# Patient Record
Sex: Male | Born: 1960 | Race: White | Hispanic: No | Marital: Married | Smoking: Never smoker
Health system: Southern US, Community
[De-identification: ages and names within clinical notes are randomized; demographics above are authoritative.]

## PROBLEM LIST (undated history)

## (undated) DIAGNOSIS — E7849 Other hyperlipidemia: Secondary | ICD-10-CM

## (undated) HISTORY — DX: Other hyperlipidemia: E78.49

---

## 1997-09-18 ENCOUNTER — Encounter: Admission: RE | Admit: 1997-09-18 | Discharge: 1997-12-17 | Payer: Self-pay | Admitting: Family Medicine

## 2007-07-13 ENCOUNTER — Encounter: Admission: RE | Admit: 2007-07-13 | Discharge: 2007-07-13 | Payer: Self-pay | Admitting: Cardiology

## 2008-05-12 ENCOUNTER — Ambulatory Visit: Payer: Self-pay | Admitting: Internal Medicine

## 2008-05-15 ENCOUNTER — Ambulatory Visit: Payer: Self-pay | Admitting: Internal Medicine

## 2008-05-15 LAB — CONVERTED CEMR LAB
BUN: 21 mg/dL (ref 6–23)
Basophils Relative: 0.9 % (ref 0.0–3.0)
CO2: 30 meq/L (ref 19–32)
Calcium: 9 mg/dL (ref 8.4–10.5)
Chloride: 108 meq/L (ref 96–112)
Creatinine, Ser: 1.1 mg/dL (ref 0.4–1.5)
Eosinophils Absolute: 0.2 10*3/uL (ref 0.0–0.7)
Eosinophils Relative: 2.8 % (ref 0.0–5.0)
Hemoglobin: 15.1 g/dL (ref 13.0–17.0)
Lymphocytes Relative: 32.8 % (ref 12.0–46.0)
MCHC: 34.3 g/dL (ref 30.0–36.0)
MCV: 88.5 fL (ref 78.0–100.0)
Neutro Abs: 2.9 10*3/uL (ref 1.4–7.7)
Neutrophils Relative %: 51.4 % (ref 43.0–77.0)
RBC: 4.98 M/uL (ref 4.22–5.81)
WBC: 5.8 10*3/uL (ref 4.5–10.5)

## 2008-05-21 ENCOUNTER — Ambulatory Visit (HOSPITAL_COMMUNITY): Admission: RE | Admit: 2008-05-21 | Discharge: 2008-05-22 | Payer: Self-pay | Admitting: Internal Medicine

## 2008-05-21 ENCOUNTER — Ambulatory Visit: Payer: Self-pay | Admitting: Internal Medicine

## 2008-06-21 DIAGNOSIS — E669 Obesity, unspecified: Secondary | ICD-10-CM

## 2008-06-21 DIAGNOSIS — I491 Atrial premature depolarization: Secondary | ICD-10-CM | POA: Insufficient documentation

## 2008-06-21 DIAGNOSIS — E785 Hyperlipidemia, unspecified: Secondary | ICD-10-CM

## 2008-06-21 DIAGNOSIS — I498 Other specified cardiac arrhythmias: Secondary | ICD-10-CM | POA: Insufficient documentation

## 2008-07-18 ENCOUNTER — Encounter (INDEPENDENT_AMBULATORY_CARE_PROVIDER_SITE_OTHER): Payer: Self-pay | Admitting: *Deleted

## 2010-07-20 NOTE — Op Note (Signed)
[name], [name]            ACCOUNT NO.:  [id number]   MEDICAL RECORD NO.:  [id number]          PATIENT TYPE:  OIB   LOCATION:  3731                         FACILITY:  [facility]   PHYSICIAN:  Doylene Canning. Ladona Ridgel, MD    DATE OF BIRTH:  1960/09/20   DATE OF PROCEDURE:  DATE OF DISCHARGE:                               OPERATIVE REPORT   PROCEDURE PERFORMED:  Left physiologic study and RF catheter ablation of  a concealed left lateral accessory pathway which had been causing  supraventricular tachycardia.   INTRODUCTION:  The patient is a 50 year old male with a long-standing  history of tachy palpitations which have dramatically increased in  frequency and severity in the last year.  In the last several weeks, he  has had episodes almost daily.  These start and stop suddenly.  He has  been intolerant to medical therapy, and he is now referred for catheter  ablation.  The patient has had documented SVT at rates of 140-160 beats  per minute.  His baseline ECG demonstrates normal sinus rhythm with no  pre-excitation.   PROCEDURE:  After informed consent was obtained, the patient was taken  to the diagnostic EP lab in a fasting state.  Usual preparation and  draping, intravenous fentanyl and midazolam was given for sedation.  A 6-  [place] hexapolar catheter was inserted percutaneously into the right  jugular vein and advanced to the coronary sinus.  This was under x-ray  guidance.  A 5-French quadripolar catheter was inserted percutaneously  in the right femoral vein and advanced under fluoroscopic guidance to  the right ventricle.  A 5-French quadripolar catheter was inserted  percutaneously in the right femoral vein and advanced to the His bundle  region.  After measurement of the basic intervals, rapid ventricular  pacing was carried out at 600 msec demonstrating eccentric atrial  activation.  Rapid ventricular pacing was carried out down to 350 msec  except where the VA Wenckebach  cycle length was demonstrated in the  accessory pathway.  Programmed ventricular stimulation was then carried  out at base drive cycle length of 161 msec from the right ventricle.  The S1-S2 interval was stepwise decreased to 360 msec where the  retrograde pathway ERP was observed.  During programmed ventricular  stimulation, the atrial activation was eccentric and nondecremental.  There were brief prepares of nonsustained SVT during programmed  ventricular stimulation.  Next, rapid atrial pacing was carried out from  the coronary sinus in the high right atrium at base drive cycle length  of 096 msec and stepwise decreased down to 430 msec where AV Wenckebach  was observed.  It should be noted that there was no evidence of any  antegrade accessory pathway conduction during rapid atrial pacing both  in the right atrium as well as in the coronary sinus.  Next, programmed  atrial stimulation was carried out from the coronary sinus in the right  atrium with base drive cycle length of 045 msec and the S1-S2 interval  was stepwise decreased to 420 msec.  There were multiple AH jumps and  echo beats during programmed atrial  stimulation, but there was no  inducible SVT.  Again, there was no evidence of any pre-excitation with  atrial pacing.  At this point, with nonsustained SVT and clear  nondecremental eccentric atrial activation demonstrating a concealed  left lateral accessory pathway, the 7-French quadripolar ablation  catheter was inserted percutaneously in the right femoral artery and  advanced retrograde across the aortic valve into the left ventricle.  Heparin 5000 units was then given.  Mapping was then carried out with  ventricular pacing.  The earliest atrial activation was demonstrated at  a position approximately 3 o'clock on the mitral valve annulus.  RF  energy application was initially applied to the atrial insertion of the  accessory pathway but contact was difficult.   Eventually, the ablation  catheter was withdrawn just slightly into the ventricular insertion, and  with 2 additional RF energy applications for a total of 6 RF energy  applications accessory pathway conduction was abolished and ventricular  pacing demonstrated VA dissociation.  At this point, the patient was  observed for 45 minutes.  During this time, because of the patient's AH  jumps and echo beats, isoproterenol was infused and rapid atrial pacing  was subsequently carried out.  This demonstrated no evidence of any  additional inducible SVT and no additional evidence of any accessory  pathway conduction either with atrial pacing or with ventricular pacing.  The catheter was then removed.  Hemostasis was assured, and the patient  was returned to his room in satisfactory condition.   COMPLICATIONS:  There were no immediate procedure complications.   FULL RESULTS:  1. Baseline ECG.  Baseline ECG demonstrates sinus rhythm with frequent      PVCs.  2. Baseline intervals.  Sinus node cycle length was 900 msec.  The HV      interval was 45 msec.  QRS duration was 70 msec.  The AH interval      was approximately 100 msec.  3. Rapid ventricular pacing.  Rapid ventricular pacing was carried out      from the RV apex demonstrating VA Wenckebach cycle length of 350      msec with nondecremental eccentric atrial activation.  Following      successful catheter ablation, VA dissociation was demonstrated at      600 msec.  4. Programmed ventricular stimulation.  Programmed ventricular      stimulation was carried out from the RV apex at a base drive cycle      length of 600 msec in the S1-S2 interval stepwise decreased down to      360 msec where retrograde block in the accessory pathway was      demonstrated.  Following successful ablation, VA dissociation was      present with no evidence of any accessory pathway conduction.  With      isoproterenol infused, VA Wenckebach was demonstrated  through the      AV node at approximately 480 msec.  5. Rapid atrial pacing.  Rapid atrial pacing was carried out from the      coronary sinus and the high right atrium with baseline cycle length      of 600 msec and stepwise decreased down to 430 msec where AV      Wenckebach was observed.  During rapid atrial pacing, the PR      interval was less than the RR interval, and there was no sustained      SVT.  Following catheter ablation and on isoproterenol, rapid  atrial pacing was again carried out again demonstrating that the PR      interval was less than the RR interval, and there was no inducible      SVT.  6. Programmed atrial stimulation.  Programmed atrial stimulation was      carried out from the coronary sinus and high right atrium with base      drive cycle length of 409 msec with and without isoproterenol.      During programmed atrial stimulation, there was no evidence of any      antegrade accessory pathway conduction, and the AV node ERP was      600/420.  There were AH jumps initially and echo beats, but no      inducible SVT.  Following ablation, AH jumps persisted, but there      were no echo beats noted.  7. Arrhythmias observed.      a.     AV reentrant tachycardia.  Initiation was spontaneous.       Duration was nonsustained.  Termination was spontaneous.  Cycle       length was approximately 400 msec  8. Mapping.  Mapping of the patient's accessory pathway demonstrated      accessory pathway conduction to be present at approximately 3      o'clock on the mitral valve annulus.  9. RF energy application total of 6 RF energy applications were      delivered.  Initial to the atrial and then on the ventricular      insertion of the accessory pathway along the mitral valve annulus      at about 3 o'clock in the LAO projection.  During the fifth RF      energy application, VA dissociation was produced, and accessory      pathway conduction was abolished.  A bonus RF  energy application      was delivered then, and the patient was observed for 45 minutes      with no SVT and no accessory pathway conduction.  It should be      noted that following the conclusion of the procedure, the patient      had frequent PACs and PJCs.   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  A total of 6 RF energy  applications were delivered, and following ablation there was no  inducible SVT and no evidence of any residual accessory pathway  conduction.      Doylene Canning. Ladona Ridgel, MD  Electronically Signed     GWT/MEDQ  D:  05/21/2008  T:  05/21/2008  Job:  [id number]   cc:   Georga Hacking, M.D.  Holley Bouche, M.D.

## 2010-07-20 NOTE — Assessment & Plan Note (Signed)
[facility]                         ELECTROPHYSIOLOGY OFFICE NOTE   NAME:[name], Pellow                   MRN:          [id number]  DATE:05/12/2008                            DOB:          [birth year]    Mr. Tozzi is referred today by Dr. Viann Fish for evaluation of  SVT.  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  He has had a long history of  tachy palpitations which initially developed back in his teens.  They  were quiet for many years and asymptomatic but over the last several  years they have increased in frequency and severity.  He notes that in  the last year, he has had episodes over once a week, typically lasting  for up to an hour duration.  His episodes occur suddenly and stop  suddenly and leave him tired for the next day.  He has taken verapamil  p.r.n., though, he has not had much success with this.  He notes his  episodes of tachy palpitations have been as fast as to 200 beats per  minute.  He is referred now for additional evaluation.  In addition, he  has very mild chest pressure with his episodes of SVT.  He also gets  lightheaded but has never had frank syncope.   His past medical history is notable for dyslipidemia.  He has very mild  obesity.   His past surgical history is notable for knee surgery on the left leg.  He has a history of dyslipidemia.   Family history is negative for premature coronary artery disease.   SOCIAL HISTORY:  The patient rarely uses alcohol.  He does not smoke  cigarettes.  He is married.   He gives no known drug allergies.   His review of systems is really negative except as noted in the HPI,  otherwise, all systems reviewed and negative.   PHYSICAL EXAMINATION:  GENERAL:  He is a pleasant well-appearing middle-  aged man in no distress.  VITAL SIGNS:  Blood pressure was 120/70, the pulse was 62 and regular,  respirations were 18, the weight was 228 pounds.  HEENT:  Normocephalic and atraumatic.  Pupils equal and round.  Oropharynx is moist.  Sclerae anicteric.  NECK:  No jugular distention.  There are no thyromegaly.  Trachea is  midline.  The carotids are 2+ and symmetric.  LUNGS:  Clear bilaterally on auscultation.  No wheezes, rales, or  rhonchi are present.  No increased work of breathing.  CARDIOVASCULAR:  Regular rate and rhythm.  Normal S1 and S2.  The PMI  was not enlarged, nor was it laterally displaced.  ABDOMEN:  Soft, nontender.  There is no organomegaly.  EXTREMITIES:  No cyanosis, clubbing, or edema.  Pulses were 2+ and  symmetric.  NEUROLOGIC:  He was alert and oriented x3.  His cranial nerves intact.  Strength is 5/5 and symmetric.  SKIN:  Normal.  Joint exam was normal except for a scar in his left leg.   IMPRESSION:  The EKG demonstrates sinus rhythm with no  obvious pre-  excitation.   IMPRESSION:  1. Recurrent supraventricular tachycardia.  2. Dyslipidemia.   DISCUSSION:  I recommended proceeding with electrophysiologic study and  catheter ablation.  The patient had been unable to be benefited by  verapamil and his episodes are increasingly frequent and severe.  The  risks, benefits, goals, expectations, of the procedure had been  discussed with the patient, and he will call us when he would like to  proceed with this.     Doylene Canning. Ladona Ridgel, MD  Electronically Signed    GWT/MedQ  DD: 05/12/2008  DT: 05/13/2008  Job #: [id number]

## 2010-07-20 NOTE — Discharge Summary (Signed)
NAMEBREEZE, [name]            ACCOUNT NO.:  [id number]   MEDICAL RECORD NO.:  [id number]          PATIENT TYPE:  OIB   LOCATION:  3731                         FACILITY:  [facility]   PHYSICIAN:  Doylene Canning. Ladona Ridgel, MD    DATE OF BIRTH:  November 12, 1960   DATE OF ADMISSION:  05/21/2008  DATE OF DISCHARGE:  05/22/2008                               DISCHARGE SUMMARY   This patient has no known drug allergies.  Time for the dictation and  examination for the patient greater than 35 minutes.   FINAL DIAGNOSES:  1. 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  2. Long-term history of tachypalpitations since teenage years.      a.     Palpitation occurs with breakthrough on verapamil.      b.     The patient experiences fatigue after these episodes, which       have increased in frequency and duration.   SECONDARY DIAGNOSES:  1. Dyslipidemia.  2. Evidence of supraventricular tachycardia when the patient wore a      heart monitor.  3. Echocardiogram Jul 26, 2004, ejection fraction of 60%, mitral valve      and tricuspid valve normal.   PROCEDURE:  May 21, 2008 electrophysiology study, radiofrequency  catheter ablation of atrioventricular reentry tachycardia, which  activities through a concealed left lateral accessory pathway, Dr. Lewayne Bunting.  The patient had no recurrence of his dysrhythmia.  He had mild  nausea after the procedure.  He was given Zofran and mild fluid bolus  and has done well.   BRIEF HISTORY:  Mr. [name] is a 50 year old male.  He has a long  history of tachypalpitations.  They developed initially back when he was  a teenager.  They have been quiescent for many years, but over the last  several years, they have increased in frequency and severity.  He notes  that in the last year, he has an episode about once a week.  Sometimes  they last up to an hour.  They start abruptly, they stop suddenly, they  leave him tired the whole next day.  He has verapamil to take as needed,  but he has not had success with this medication.   He notes that the tachypalpitations have been as fast as 200 beats per  minute.  He has had an event monitor, which shows evidence of narrow  complex supraventricular tachycardia.  He was referred by Dr. Viann Fish to Dr. Ladona Ridgel.  The patient says that he has lightheadedness with  these events, but never any frank syncope.  The risks and benefits of  catheter ablation study and radiofrequency catheter ablation have been  discussed with the patient and he wishes to proceed.   HOSPITAL COURSE:  The patient presents electively on May 21, 2008.  He  underwent electrophysiology study with application of radiofrequency  energy.  At about 3 o'clock on the mitral valve annulus, 6 burst were  delivered to a left lateral accessory pathway, which stopped his SVT and  he had no A-V conduction noted during these dysrhythmias.  There was no  recurrence of  dysrhythmia postprocedure.  The patient is discharging in  postprocedure day #1.  He goes home on the following medications;  1. Crestor 20 mg daily.  2. Enteric-coated aspirin 81 mg daily.  He has been given amoxicillin      as prophylaxis against endocarditis should he have dental work in      the next 6 months.   He has a followup with Dr. Lewayne Bunting at [facility], [address], Wednesday, June 25, 2008 at 8:30.  Laboratory  studies pertinent to this admission were drawn on May 15, 2008; white  cells 5.8, hemoglobin 15.1, hematocrit 44.1, and platelets are 151.  Protime 10.8, INR 1.0.  Sodium 141, potassium 4.4, chloride 108,  carbonate 30, glucose of 78, BUN is 21, creatinine 1.1.      Maple Mirza, PA      Doylene Canning. Ladona Ridgel, MD   Electronically Signed    GM/MEDQ  D:  05/22/2008  T:  05/22/2008  Job:  [id number]   cc:   Georga Hacking, M.D.

## 2016-04-15 ENCOUNTER — Other Ambulatory Visit: Payer: Self-pay | Admitting: Orthopedic Surgery

## 2016-04-15 ENCOUNTER — Ambulatory Visit
Admission: RE | Admit: 2016-04-15 | Discharge: 2016-04-15 | Disposition: A | Discharge status: Home / Self Care | Payer: BC Managed Care – PPO | Source: Ambulatory Visit | Attending: Orthopedic Surgery | Admitting: Orthopedic Surgery

## 2016-04-15 DIAGNOSIS — M1611 Unilateral primary osteoarthritis, right hip: Secondary | ICD-10-CM

## 2016-07-13 ENCOUNTER — Ambulatory Visit
Admission: RE | Admit: 2016-07-13 | Discharge: 2016-07-13 | Disposition: A | Discharge status: Home / Self Care | Payer: BC Managed Care – PPO | Source: Ambulatory Visit | Attending: Family Medicine | Admitting: Family Medicine

## 2016-07-13 ENCOUNTER — Other Ambulatory Visit: Payer: Self-pay | Admitting: Family Medicine

## 2016-07-13 DIAGNOSIS — Z01818 Encounter for other preprocedural examination: Secondary | ICD-10-CM

## 2018-04-29 IMAGING — CR DG CHEST 2V
2 series · 2 of 2 positions shown · non-contrast
Comparison: July 13, 2007

CLINICAL DATA: Preoperative evaluation for hip surgery

EXAM:
CHEST  2 VIEW

[w chest pa]
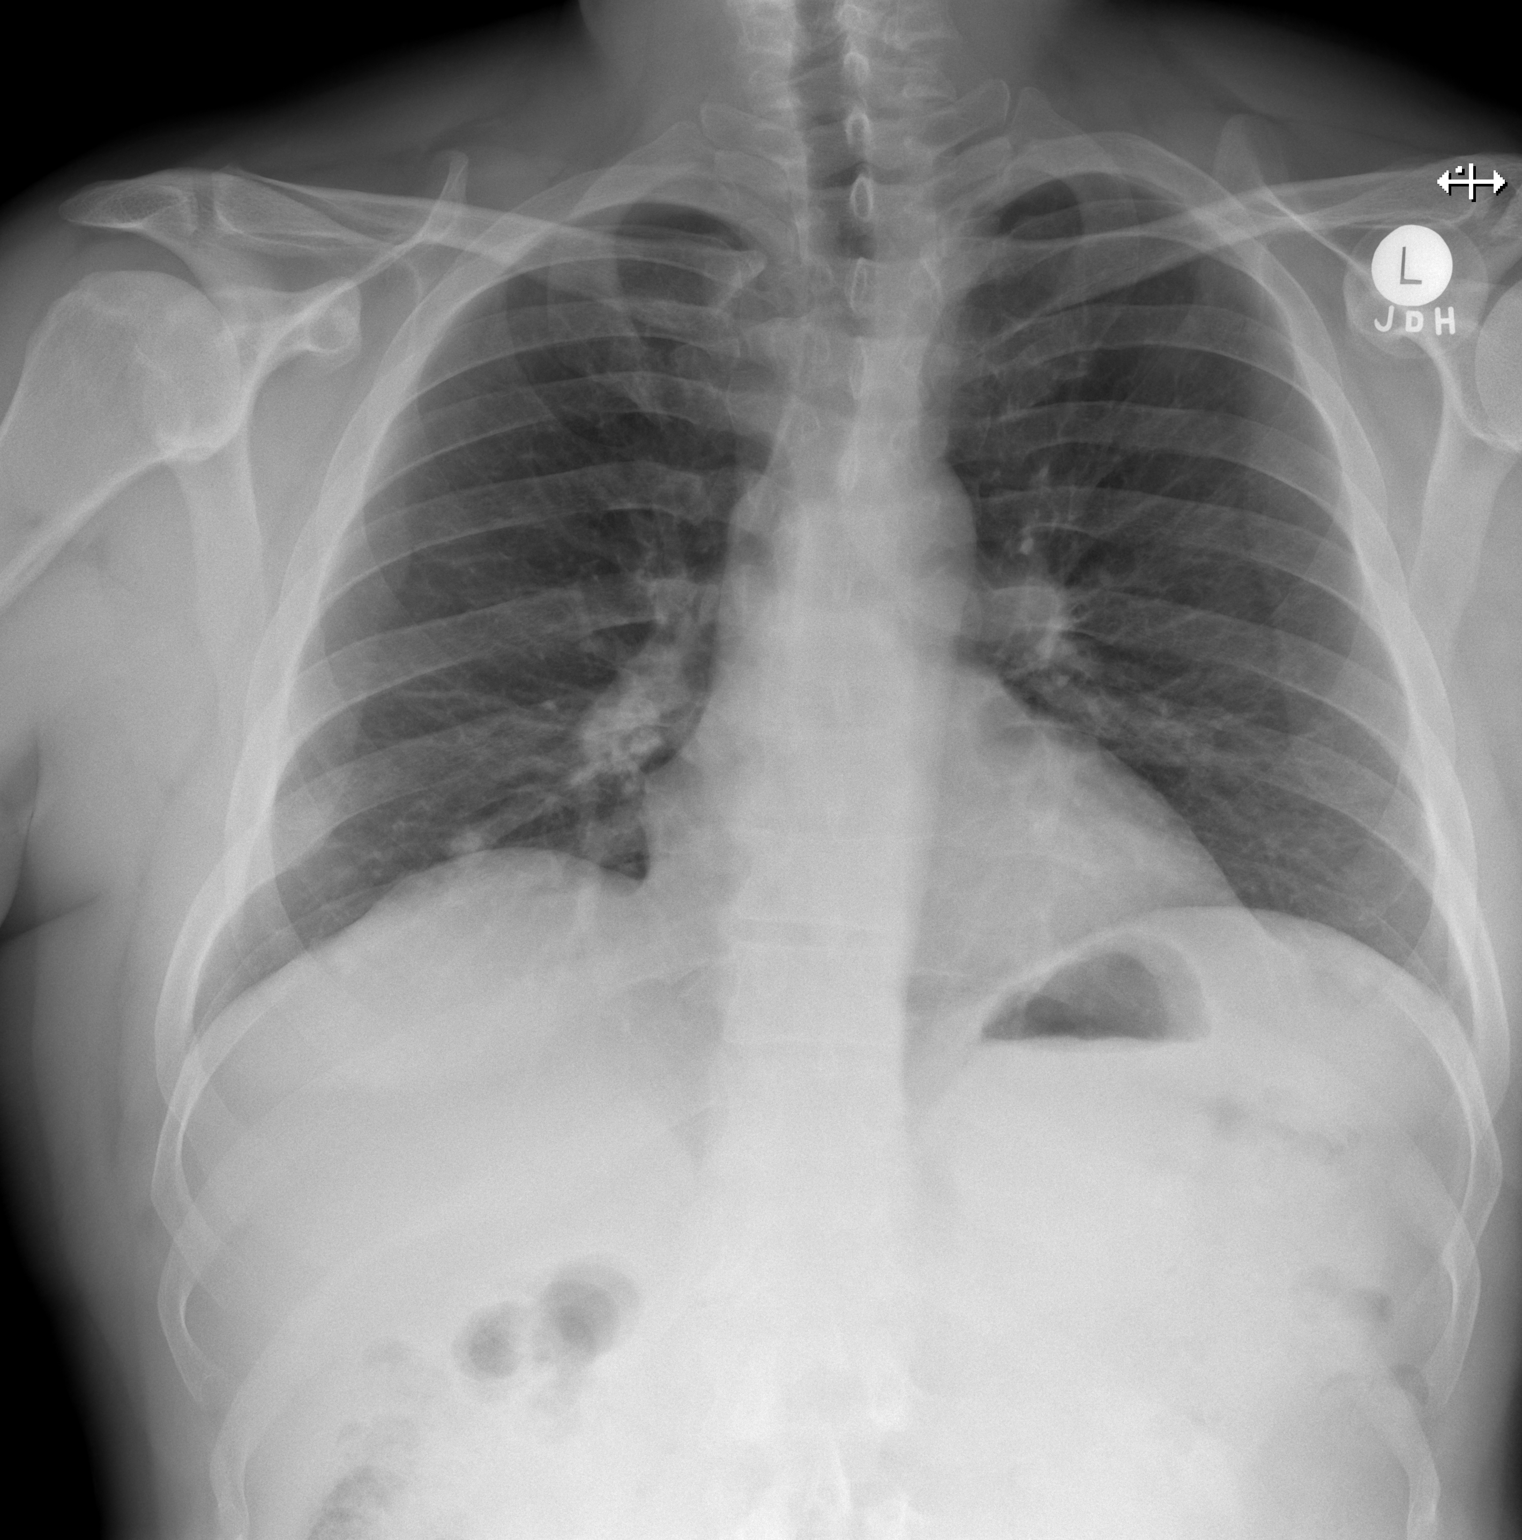

[w chest lat]
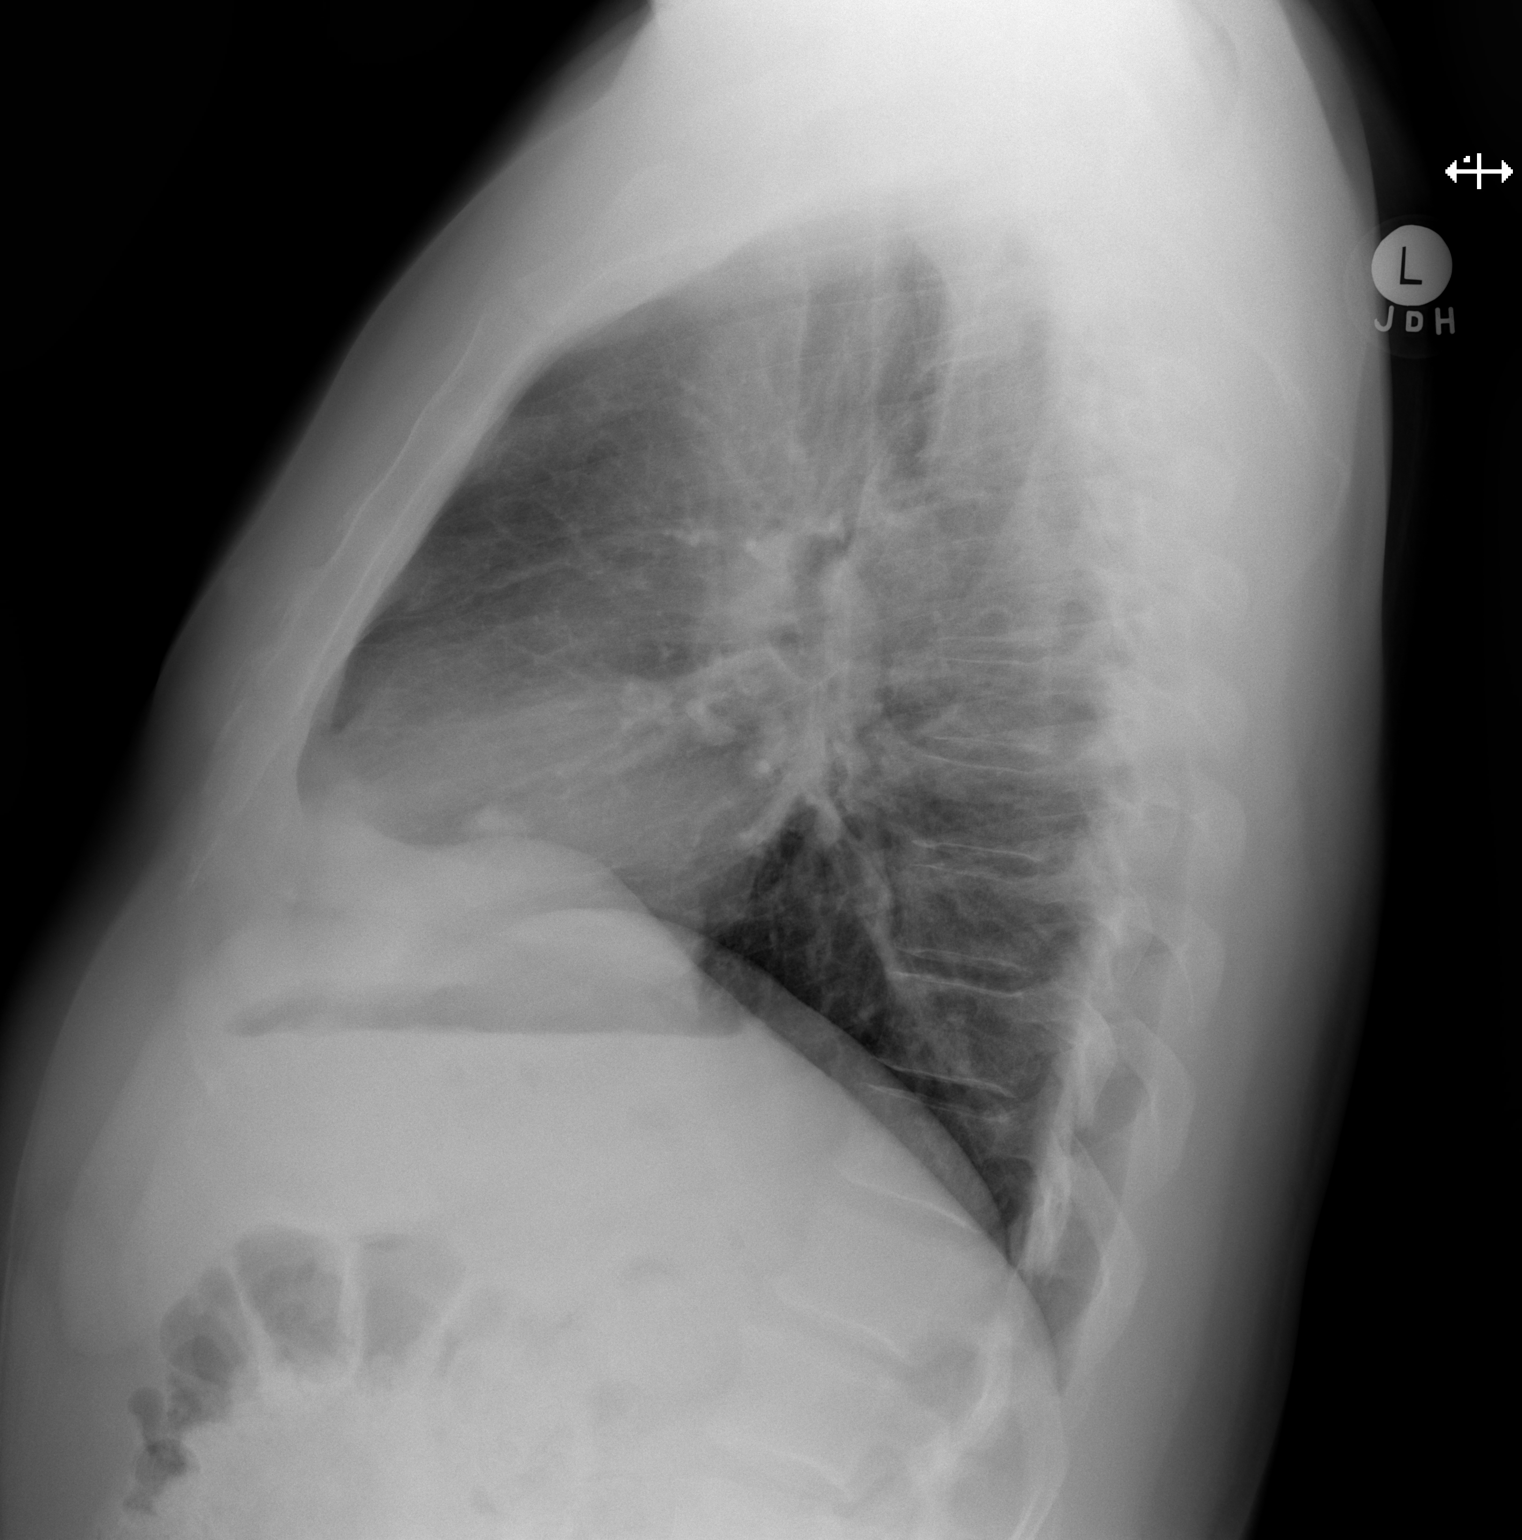

[2 of 2 positions shown; findings below may reference images not displayed]

FINDINGS: There is an apparent granuloma in the right base, stable from prior
study. Lungs elsewhere clear. Heart size and pulmonary vascularity
are normal. No adenopathy. No evident bone lesions.
IMPRESSION: Stable granuloma right base.  No edema or consolidation.

## 2021-07-26 ENCOUNTER — Ambulatory Visit (INDEPENDENT_AMBULATORY_CARE_PROVIDER_SITE_OTHER): Payer: BC Managed Care – PPO | Admitting: Internal Medicine

## 2021-07-26 ENCOUNTER — Telehealth: Payer: Self-pay | Admitting: Internal Medicine

## 2021-07-26 ENCOUNTER — Encounter: Payer: Self-pay | Admitting: Internal Medicine

## 2021-07-26 VITALS — BP 149/93 | HR 69 | Ht 71.0 in | Wt 244.4 lb

## 2021-07-26 DIAGNOSIS — T466X5A Adverse effect of antihyperlipidemic and antiarteriosclerotic drugs, initial encounter: Secondary | ICD-10-CM

## 2021-07-26 DIAGNOSIS — E7801 Familial hypercholesterolemia: Secondary | ICD-10-CM

## 2021-07-26 DIAGNOSIS — M791 Myalgia, unspecified site: Secondary | ICD-10-CM | POA: Diagnosis not present

## 2021-07-26 DIAGNOSIS — Z8249 Family history of ischemic heart disease and other diseases of the circulatory system: Secondary | ICD-10-CM

## 2021-07-26 MED ORDER — REPATHA SURECLICK 140 MG/ML ~~LOC~~ SOAJ: 1.0000 | SUBCUTANEOUS | 3 refills | Status: DC

## 2021-07-26 NOTE — Progress Notes (Signed)
LIPID CLINIC CONSULT NOTE  Chief Complaint:  Familial hyperlipidemia  Primary Care Physician: Antony Contras, MD  Primary Cardiologist:  None  HPI:  [name] is a 61 y.o. male who is being seen today for the evaluation of familial hyperlipidemia at the request of Antony Contras, MD. 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  He reports a longstanding history of high cholesterol as well as high cholesterol in multiple family members.  Particularly his mother had high cholesterol and had her first MI in her 11s.  He also has a sister with high cholesterol.  In the past he has been on both Lipitor and Crestor but had side effects with those including myalgias.  He had recent labs which indicated untreated lipids.  His total cholesterol was 318, HDL 49, triglycerides 351 and LDL 198.  PMHx:  Past Medical History:  Diagnosis Date   Familial hyperlipidemia     FAMHx:  Family History  Problem Relation Age of Onset   Hyperlipidemia Mother    Heart attack Mother     SOCHx:   has no history on file for tobacco use, alcohol use, and drug use.  ALLERGIES:  Not on File  ROS: Pertinent items noted in HPI and remainder of comprehensive ROS otherwise negative.  HOME MEDS: Current Outpatient Medications on File Prior to Visit  Medication Sig Dispense Refill   fluticasone (FLONASE) 50 MCG/ACT nasal spray Place into the nose.     levocetirizine (XYZAL) 5 MG tablet SMARTSIG:1 Tablet(s) By Mouth Every Evening     EPINEPHrine 0.3 mg/0.3 mL IJ SOAJ injection See admin instructions. (Patient not taking: Reported on 07/26/2021)     No current facility-administered medications on file prior to visit.    LABS/IMAGING: No results found for this or any previous visit (from the past 48 hour(s)). No results found.  LIPID PANEL: No results found for:  CHOL, TRIG, HDL, CHOLHDL, VLDL, LDLCALC, LDLDIRECT  WEIGHTS: Wt Readings from Last 3 Encounters:  07/26/21 244 lb 6.4 oz (110.9 kg)    VITALS: BP (!) 149/93   Pulse 69   Ht 5\' 11"  (1.803 m)   Wt 244 lb 6.4 oz (110.9 kg)   SpO2 96%   BMI 34.09 kg/m   EXAM: General appearance: alert and no distress Neck: no carotid bruit, no JVD, and thyroid not enlarged, symmetric, no tenderness/mass/nodules Lungs: clear to auscultation bilaterally Heart: regular rate and rhythm, S1, S2 normal, no murmur, click, rub or gallop Abdomen: soft, non-tender; bowel sounds normal; no masses,  no organomegaly Extremities: extremities normal, atraumatic, no cyanosis or edema Pulses: 2+ and symmetric Skin: Skin color, texture, turgor normal. 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   Mr. Cypret has probable familial hyperlipidemia with very high total cholesterol, LDL and triglycerides.  His mother also had high cholesterol and early onset heart disease.  He is asymptomatic at this time but is concerned about whether he may have developed some coronary artery disease.  I advised a calcium score.  In addition will recommend PCSK9 inhibitor for primary lipid-lowering given his LDL much greater than 190 and presumptive diagnosis of familial hyperlipidemia.  Unfortunately, he could not tolerate statins.  He may  need additional therapy going forward.  We will also go ahead and perform genetic testing today as he has children who should likely be screened.  Plan follow-up with me including a lipid NMR and LP(a) in about 3 to 4 months.  Thanks again for the kind referral  Pixie Casino, MD, FACC, [place] Director of the [facility] Diplomate of the American Board of Clinical Lipidology Attending Cardiologist  Direct Dial: [phone]  Fax: [phone]  Website:  [email or web address] Vales 07/26/2021, 5:04 PM

## 2021-07-26 NOTE — Telephone Encounter (Signed)
Genetic test for dyslipidemia/ASCVD ordered ([facility]) Cheek swab completed in office Specimen and necessary paperwork mailed. ID: [id number]

## 2021-07-26 NOTE — Patient Instructions (Signed)
Medication Instructions:  Dr. Debara Pickett recommends Repatha Sureclick or Praluent  (PCSK9). This is an injectable cholesterol medication self-administered once every 14 days. This medication will likely need prior approval with your insurance company, which we will work on. If the medication is not approved initially, we may need to do an appeal with your insurance.   Administer medication in area of fatty tissue such as abdomen, outer thigh, back of upper arm - and rotate site with each injection Store medication in refrigerator until ready to administer - allow to sit at room temp for 30 mins - 1 hour prior to injection Dispose of medication in a SHARPS container - your pharmacy should be able to direct you on this and proper disposal   If you need a co-pay card for Repatha: [email or web address] >> paying for Repatha or red box that says "Repatha Copay Card" in top right If you need a co-pay card for Praluent: [email or web address]://[email or web address].[email or web address].uy  Patient Assistance:    These foundations have funds at various times.   The PAN Foundation: https://www.panfoundation.org/disease-funds/hypercholesterolemia/ -- can sign up for wait list  The [facility] offers assistance to help pay for medication copays.  They will cover copays for all cholesterol lowering meds, including statins, fibrates, omega-3 fish oils like Vascepa, ezetimibe, Repatha, Praluent, Nexletol, Nexlizet.  The cards are usually good for $2,500 or 12 months, whichever comes first. Go to healthwellfoundation.org Click on "Apply Now" Answer questions as to whom is applying (patient or representative) Your disease fund will be "hypercholesterolemia - Medicare access" They will ask questions about finances and which medications you are taking for cholesterol When you submit, the approval is usually within minutes.  You will need to print the card information from the site You will need to show this information to your pharmacy,  they will bill your Medicare Part D plan first -then bill Health Well --for the copay.   You can also call them at [phone], although the hold times can be quite long.     *If you need a refill on your cardiac medications before your next appointment, please call your pharmacy*   Lab Work: FASTING lab work to check cholesterol in about 4 months -- complete about 1 week before next appointment   If you have labs (blood work) drawn today and your tests are completely normal, you will receive your results only by: [place] (if you have MyChart) OR A paper copy in the mail If you have any lab test that is abnormal or we need to change your treatment, we will call you to review the results.   Testing/Procedures: Genetic Test -- results will be available in 2-3 weeks (will come to your email) and Dr. Debara Pickett will reach out to explain  Dr. Debara Pickett  has ordered a CT coronary calcium score.   Test locations:  [facility] ([address] [place], [place] [address]) [facility] ([address] [place], [place] [address])   This is $99 out of pocket.   Coronary CalciumScan A coronary calcium scan is an imaging test used to look for deposits of calcium and other fatty materials (plaques) in the inner lining of the blood vessels of the heart (coronary arteries). These deposits of calcium and plaques can partly clog and narrow the coronary arteries without producing any symptoms or warning signs. This puts a person at risk for a heart attack. This test can detect these deposits before symptoms develop. Tell a health care provider about: Any allergies you have. All  medicines you are taking, including vitamins, herbs, eye drops, creams, and over-the-counter medicines. Any problems you or family members have had with anesthetic medicines. Any blood disorders you have. Any surgeries you have had. Any medical conditions you have. Whether you are pregnant or may be  pregnant. What are the risks? Generally, this is a safe procedure. However, problems may occur, including: Harm to a pregnant woman and her unborn baby. This test involves the use of radiation. Radiation exposure can be dangerous to a pregnant woman and her unborn baby. If you are pregnant, you generally should not have this procedure done. Slight increase in the risk of cancer. This is because of the radiation involved in the test. What happens before the procedure? No preparation is needed for this procedure. What happens during the procedure? You will undress and remove any jewelry around your neck or chest. You will put on a hospital gown. Sticky electrodes will be placed on your chest. The electrodes will be connected to an electrocardiogram (ECG) machine to record a tracing of the electrical activity of your heart. A CT scanner will take pictures of your heart. During this time, you will be asked to lie still and hold your breath for 2-3 seconds while a picture of your heart is being taken. The procedure may vary among health care providers and hospitals. What happens after the procedure? You can get dressed. You can return to your normal activities. It is up to you to get the results of your test. Ask your health care provider, or the department that is doing the test, when your results will be ready. Summary A coronary calcium scan is an imaging test used to look for deposits of calcium and other fatty materials (plaques) in the inner lining of the blood vessels of the heart (coronary arteries). Generally, this is a safe procedure. Tell your health care provider if you are pregnant or may be pregnant. No preparation is needed for this procedure. A CT scanner will take pictures of your heart. You can return to your normal activities after the scan is done. This information is not intended to replace advice given to you by your health care provider. Make sure you discuss any questions  you have with your health care provider. Document Released: 08/20/2007 Document Revised: 01/11/2016 Document Reviewed: 01/11/2016 Elsevier Interactive Patient Education  2017 [place]: At [facility], you and your health needs are our priority.  As part of our continuing mission to provide you with exceptional heart care, we have created designated Provider Care Teams.  These Care Teams include your primary Cardiologist (physician) and Advanced Practice Providers (APPs -  Physician Assistants and Nurse Practitioners) who all work together to provide you with the care you need, when you need it.  We recommend signing up for the patient portal called "MyChart".  Sign up information is provided on this After Visit Summary.  MyChart is used to connect with patients for Virtual Visits (Telemedicine).  Patients are able to view lab/test results, encounter notes, upcoming appointments, etc.  Non-urgent messages can be sent to your provider as well.   To learn more about what you can do with MyChart, go to [email or web address].    Your next appointment:   4 month(s)  The format for your next appointment:   In Person  Provider:   Lyman Bishop MD -- lipid clinic -- [place]

## 2021-07-27 ENCOUNTER — Telehealth: Payer: Self-pay | Admitting: Internal Medicine

## 2021-07-27 NOTE — Telephone Encounter (Signed)
PA submitted via CMM (Key: [id number]) [id number]

## 2021-07-27 NOTE — Telephone Encounter (Signed)
[facility] was calling to verify that the office got a Prior Auth request for this patient's Evolocumab (REPATHA SURECLICK) [id number] MG/ML SOAJ . Please use Rx # [phone] as a reference

## 2021-07-28 NOTE — Telephone Encounter (Signed)
   Pal with [facility] pharmacy calling, she said that repatha was declined and Economist, she said it still required a PA but it is most likely to be approved by insurance. She gave ref# [id number]

## 2021-08-05 NOTE — Telephone Encounter (Signed)
PA for Praluent submitted by [facility] Key: [id number], PA Case ID: [id number].

## 2021-08-05 NOTE — Telephone Encounter (Signed)
PA for Praluent Approved from 08/05/2021 to 08/06/2022.

## 2021-08-09 NOTE — Telephone Encounter (Signed)
Patient would like to wait until July 1 and see about Repatha approval as insurance preferred med should be changing at that time

## 2021-08-09 NOTE — Telephone Encounter (Signed)
MyChart message sent to patient with med update

## 2021-08-17 ENCOUNTER — Encounter: Payer: Self-pay | Admitting: Internal Medicine

## 2021-09-01 ENCOUNTER — Ambulatory Visit (INDEPENDENT_AMBULATORY_CARE_PROVIDER_SITE_OTHER)
Admission: RE | Admit: 2021-09-01 | Discharge: 2021-09-01 | Disposition: A | Discharge status: Home / Self Care | Payer: Self-pay | Source: Ambulatory Visit | Attending: Internal Medicine | Admitting: Internal Medicine

## 2021-09-01 DIAGNOSIS — E7801 Familial hypercholesterolemia: Secondary | ICD-10-CM

## 2021-09-06 NOTE — Telephone Encounter (Addendum)
Attempted PA for Repatha Sureclick in [facility] as insurance preference changed (Key: [id number]) Message received:  Your PA has been resolved, no additional PA is required. For further inquiries please contact the number on the back of the member prescription card  Called pharmacy to confirm no PA needed. Was notified med is OK to fill. Patient updated via MyChart message and provided link to co-pay card

## 2021-10-22 ENCOUNTER — Ambulatory Visit (HOSPITAL_BASED_OUTPATIENT_CLINIC_OR_DEPARTMENT_OTHER): Payer: BC Managed Care – PPO | Admitting: Internal Medicine

## 2021-11-19 ENCOUNTER — Ambulatory Visit: Payer: BC Managed Care – PPO | Attending: Internal Medicine

## 2021-11-19 DIAGNOSIS — E7801 Familial hypercholesterolemia: Secondary | ICD-10-CM

## 2021-11-19 NOTE — Addendum Note (Signed)
Addended by: Baird Lyons on: 11/19/2021 09:52 AM   Modules accepted: Orders

## 2021-11-20 LAB — NMR, LIPOPROFILE

## 2021-11-20 LAB — LIPOPROTEIN A (LPA): Lipoprotein (a): 22.6 nmol/L (ref <75.0)

## 2021-11-21 LAB — NMR, LIPOPROFILE
Cholesterol, Total: 198 mg/dL (ref 100–199)
LDL Particle Number: 1488 nmol/L — ABNORMAL HIGH (ref <1000)
LDL-C (NIH Calc): 108 mg/dL — ABNORMAL HIGH (ref 0–99)
Small LDL Particle Number: 743 nmol/L — ABNORMAL HIGH (ref <=527)

## 2021-12-07 ENCOUNTER — Telehealth: Payer: Self-pay | Admitting: Internal Medicine

## 2021-12-07 NOTE — Telephone Encounter (Signed)
Patient called. Aware OK for video visit on 10/6. Appointment type changed.

## 2021-12-07 NOTE — Telephone Encounter (Signed)
New Message:    Patient would like to know if he can do a Virtual Visit for his appointment on Friday(12-10-21)?

## 2021-12-10 ENCOUNTER — Encounter: Payer: Self-pay | Admitting: Internal Medicine

## 2021-12-10 ENCOUNTER — Ambulatory Visit: Payer: BC Managed Care – PPO | Attending: Internal Medicine | Admitting: Internal Medicine

## 2021-12-10 ENCOUNTER — Encounter: Payer: Self-pay | Admitting: *Deleted

## 2021-12-10 VITALS — Ht 71.0 in | Wt 235.0 lb

## 2021-12-10 DIAGNOSIS — M791 Myalgia, unspecified site: Secondary | ICD-10-CM | POA: Diagnosis not present

## 2021-12-10 DIAGNOSIS — Z8249 Family history of ischemic heart disease and other diseases of the circulatory system: Secondary | ICD-10-CM | POA: Diagnosis not present

## 2021-12-10 DIAGNOSIS — E7801 Familial hypercholesterolemia: Secondary | ICD-10-CM

## 2021-12-10 DIAGNOSIS — T466X5D Adverse effect of antihyperlipidemic and antiarteriosclerotic drugs, subsequent encounter: Secondary | ICD-10-CM

## 2021-12-10 MED ORDER — EZETIMIBE 10 MG PO TABS: 10.0000 mg | ORAL_TABLET | Freq: Every day | ORAL | 3 refills | Status: DC

## 2021-12-10 NOTE — Patient Instructions (Addendum)
Medication Instructions:  START Zetia 10 mg daily  *If you need a refill on your cardiac medications before your next appointment, please call your pharmacy*  Follow-Up: At [facility], you and your health needs are our priority.  As part of our continuing mission to provide you with exceptional heart care, we have created designated Provider Care Teams.  These Care Teams include your primary Cardiologist (physician) and Advanced Practice Providers (APPs -  Physician Assistants and Nurse Practitioners) who all work together to provide you with the care you need, when you need it.  We recommend signing up for the patient portal called "MyChart".  Sign up information is provided on this After Visit Summary.  MyChart is used to connect with patients for Virtual Visits (Telemedicine).  Patients are able to view lab/test results, encounter notes, upcoming appointments, etc.  Non-urgent messages can be sent to your provider as well.   To learn more about what you can do with MyChart, go to [email or web address].    Your next appointment:   Dr. Debara Pickett recommends that you schedule a follow up visit  (virtual visit) with him the in the [place] in 4 months months. Please have fasting blood work about 1 week prior to this visit and he will review the blood work results with you at your appointment.

## 2021-12-10 NOTE — Progress Notes (Signed)
Virtual Visit via Video Note   Because of Earsel Shouse Couchman's co-morbid illnesses, he is at least at moderate risk for complications without adequate follow up.  This format is felt to be most appropriate for this patient at this time.  All issues noted in this document were discussed and addressed.  A limited physical exam was performed with this format.  Please refer to the patient's chart for his consent to telehealth for [facility].      Date:  12/10/2021   ID:  [name], DOB 1960-08-31, MRN [id number] The patient was identified using 2 identifiers.  Evaluation Performed:  Follow-Up Visit  Patient Location:  [place] [address]  Provider location:   [address], [place], [place] [address]  PCP:  Antony Contras, MD  Cardiologist:  None Electrophysiologist:  None   Chief Complaint:  Follow-up dyslipidemia  History of Present Illness:    [name] is a 61 y.o. male who presents via audio/video conferencing for a telehealth visit today. 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  He reports a longstanding history of high cholesterol as well as high cholesterol in multiple family members.  Particularly his mother had high cholesterol and had her first MI in her 33s.  He also has a sister with high cholesterol.  In the past he has been on both Lipitor and Crestor but had side effects with those including myalgias.  He had recent labs which indicated untreated lipids.  His total cholesterol was 318, HDL 49, triglycerides 351 and LDL 198.  12/10/2021  [name] returns today for follow-up.  He has had a significant response to Repatha.  His LDL cholesterol has come down from 198 down to 108.  LDL particle number is now 1488, HDL 48, triglycerides 246 and small LDL particle number of 743,  still just above where we would like him to be.  He seems to be tolerating therapy well.  I discussed about the importance of further lipid lowering and trying to achieve a target LDL less than 70.  He is in agreement with that.  Fortunately, his LP(a) was negative.  Prior CV studies:   The following studies were reviewed today:  Chart reviewed  PMHx:  Past Medical History:  Diagnosis Date   Familial hyperlipidemia     History reviewed. No pertinent surgical history.  FAMHx:  Family History  Problem Relation Age of Onset   Hyperlipidemia Mother    Heart attack Mother     SOCHx:   reports that he does not use drugs. No history on file for tobacco use and alcohol use.  ALLERGIES:  Not on File  MEDS:  Current Meds  Medication Sig   EPINEPHrine 0.3 mg/0.3 mL IJ SOAJ injection See admin instructions.   Evolocumab (REPATHA SURECLICK) 903 MG/ML SOAJ Inject 1 Dose into the skin every 14 (fourteen) days.   ezetimibe (ZETIA) 10 MG tablet Take 1 tablet (10 mg total) by mouth daily.   fluticasone (FLONASE) 50 MCG/ACT nasal spray Place into the nose.   levocetirizine (XYZAL) 5 MG tablet SMARTSIG:1 Tablet(s) By Mouth Every Evening     ROS: Pertinent items noted in HPI and remainder of comprehensive ROS otherwise negative.  Labs/Other Tests and Data Reviewed:    Recent Labs: No results found for requested labs within last 365 days.  Recent Lipid Panel No results found for: "CHOL", "TRIG", "HDL", "CHOLHDL", "LDLCALC", "LDLDIRECT"  Wt Readings from Last 3 Encounters:  12/10/21 235 lb (106.6 kg)  07/26/21 244 lb 6.4 oz (110.9 kg)     Exam:    Vital Signs:  Ht 5\' 11"  (1.803 m)   Wt 235 lb (106.6 kg)   BMI 32.78 kg/m    General appearance: alert and no distress Lungs: No visual respiratory difficulty Abdomen: Mildly obese Extremities: extremities normal, atraumatic, no cyanosis or edema Neurologic: Grossly normal  ASSESSMENT & PLAN:    Probable familial  hyperlipidemia (Simon [place] criteria) Mother with high cholesterol and early onset coronary disease in her 72s Statin intolerance-myalgias  Mr. Nasser has had a significant improvement in his lipids on Repatha but still remains above a target LDL less than 70.  Since he cannot tolerate statins, we could consider adding ezetimibe to his regimen.  This may get him to target.  I think it will be well-tolerated.  We did discuss some possible side effects.  He is willing to start the medication.  Plan repeat lipid in about 3 to 4 months and follow-up afterwards.  Patient Risk:   After full review of this patients clinical status, I feel that they are at least moderate risk at this time.  Time:   Today, I have spent 25 minutes with the patient with telehealth technology discussing dyslipidemia.     Medication Adjustments/Labs and Tests Ordered: Current medicines are reviewed at length with the patient today.  Concerns regarding medicines are outlined above.   Tests Ordered: Orders Placed This Encounter  Procedures   NMR, lipoprofile    Medication Changes: Meds ordered this encounter  Medications   ezetimibe (ZETIA) 10 MG tablet    Sig: Take 1 tablet (10 mg total) by mouth daily.    Dispense:  90 tablet    Refill:  3    Disposition:  in 4 month(s)  51s, MD, [facility], FACP  [facility]  Medical Director of the Advanced Lipid Disorders &  Cardiovascular Risk Reduction Clinic Diplomate of the American Board of Clinical Lipidology Attending Cardiologist  Direct Dial: [phone]  Fax: [phone]  Website:  [email or web address]  [phone], MD  12/10/2021 1:09 PM

## 2022-04-19 ENCOUNTER — Other Ambulatory Visit: Payer: Self-pay | Admitting: *Deleted

## 2022-04-19 DIAGNOSIS — E7801 Familial hypercholesterolemia: Secondary | ICD-10-CM

## 2022-04-29 LAB — LIPOPROTEIN A (LPA): Lipoprotein (a): 15.1 nmol/L (ref <75.0)

## 2022-04-30 LAB — NMR, LIPOPROFILE
Cholesterol, Total: 161 mg/dL (ref 100–199)
HDL Particle Number: 38 umol/L (ref >=30.5)
HDL-C: 48 mg/dL (ref >39)
LDL Particle Number: 923 nmol/L (ref <1000)
LDL Size: 20.3 nm — ABNORMAL LOW (ref >20.5)
LDL-C (NIH Calc): 71 mg/dL (ref 0–99)
LP-IR Score: 66 — ABNORMAL HIGH (ref <=45)
Small LDL Particle Number: 524 nmol/L (ref <=527)
Triglycerides: 260 mg/dL — ABNORMAL HIGH (ref 0–149)

## 2022-05-02 ENCOUNTER — Encounter: Payer: Self-pay | Admitting: Internal Medicine

## 2022-05-02 ENCOUNTER — Ambulatory Visit: Payer: BC Managed Care – PPO | Attending: Cardiology | Admitting: Internal Medicine

## 2022-05-02 VITALS — Wt 240.0 lb

## 2022-05-02 DIAGNOSIS — E7801 Familial hypercholesterolemia: Secondary | ICD-10-CM

## 2022-05-02 DIAGNOSIS — T466X5A Adverse effect of antihyperlipidemic and antiarteriosclerotic drugs, initial encounter: Secondary | ICD-10-CM | POA: Diagnosis not present

## 2022-05-02 DIAGNOSIS — M791 Myalgia, unspecified site: Secondary | ICD-10-CM | POA: Diagnosis not present

## 2022-05-02 DIAGNOSIS — Z8249 Family history of ischemic heart disease and other diseases of the circulatory system: Secondary | ICD-10-CM | POA: Diagnosis not present

## 2022-05-02 NOTE — Progress Notes (Signed)
Virtual Visit via Video Note   Because of [name]'s co-morbid illnesses, he is at least at moderate risk for complications without adequate follow up.  This format is felt to be most appropriate for this patient at this time.  All issues noted in this document were discussed and addressed.  A limited physical exam was performed with this format.  Please refer to the patient's chart for his consent to telehealth for [facility].  Date:  05/02/2022   ID:  [name], DOB 12-Nov-1960, MRN [id number] The patient was identified using 2 identifiers.  Evaluation Performed:  Follow-Up Visit  Patient Location:  [place] [address]  Provider location:   [address] [place], [place] [address]  PCP:  Antony Contras, MD  Cardiologist:  None Electrophysiologist:  None   Chief Complaint:  Follow-up dyslipidemia  History of Present Illness:    [name] is a 62 y.o. male who presents via audio/video conferencing for a telehealth visit today. 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  He reports a longstanding history of high cholesterol as well as high cholesterol in multiple family members.  Particularly his mother had high cholesterol and had her first MI in her 69s.  He also has a sister with high cholesterol.  In the past he has been on both Lipitor and Crestor but had side effects with those including myalgias.  He had recent labs which indicated untreated lipids.  His total cholesterol was 318, HDL 49, triglycerides 351 and LDL 198.  12/10/2021  [name] returns today for follow-up.  He has had a significant response to Repatha.  His LDL cholesterol has come down from 198 down to 108.  LDL particle number is now 1488, HDL 48, triglycerides 246 and small LDL particle number of 743, still just  above where we would like him to be.  He seems to be tolerating therapy well.  I discussed about the importance of further lipid lowering and trying to achieve a target LDL less than 70.  He is in agreement with that.  Fortunately, his LP(a) was negative.  05/02/2021  [name] returns today for follow-up. He is doing well -he is tolerating the addition of ezetimibe to Repatha without any issues.  His cholesterol has improved as somewhat expected.  His LDL particle number has come down to 923 from 1488.  LDL-C is now 71, down from 108 and triglycerides have gone up slightly to 260 from 246, but were as high as 351 previously and his LDL in the past had been as high as 198.  Overall a marked improvement in his lipids.  Prior CV studies:   The following studies were reviewed today:  Chart reviewed  PMHx:  Past Medical History:  Diagnosis Date   Familial hyperlipidemia     No past surgical history on file.  FAMHx:  Family History  Problem Relation Age of Onset   Hyperlipidemia Mother    Heart attack Mother     SOCHx:   reports that he has never smoked. He has never used smokeless tobacco. He reports current alcohol use. He reports that he does not use drugs.  ALLERGIES:  Allergies  Allergen Reactions   Statins Other (See Comments)    Myalgia    MEDS:  Current Meds  Medication Sig   EPINEPHrine  0.3 mg/0.3 mL IJ SOAJ injection See admin instructions.   Evolocumab (REPATHA SURECLICK) XX123456 MG/ML SOAJ Inject 1 Dose into the skin every 14 (fourteen) days.   ezetimibe (ZETIA) 10 MG tablet Take 1 tablet (10 mg total) by mouth daily.   fluticasone (FLONASE) 50 MCG/ACT nasal spray Place 2 sprays into both nostrils daily.   levocetirizine (XYZAL) 5 MG tablet Take 5 mg by mouth at bedtime as needed for allergies.   meloxicam (MOBIC) 15 MG tablet Take 15 mg by mouth daily as needed.     ROS: Pertinent items noted in HPI and remainder of comprehensive ROS otherwise  negative.  Labs/Other Tests and Data Reviewed:    Recent Labs: No results found for requested labs within last 365 days.   Recent Lipid Panel No results found for: "CHOL", "TRIG", "HDL", "CHOLHDL", "LDLCALC", "LDLDIRECT"  Wt Readings from Last 3 Encounters:  05/02/22 240 lb (108.9 kg)  12/10/21 235 lb (106.6 kg)  07/26/21 244 lb 6.4 oz (110.9 kg)     Exam:    Vital Signs:  Wt 240 lb (108.9 kg)   BMI 33.47 kg/m    General appearance: alert and no distress Lungs: No visual respiratory difficulty Abdomen: Mildly obese Extremities: extremities normal, atraumatic, no cyanosis or edema Neurologic: Grossly normal  ASSESSMENT & PLAN:    Probable familial hyperlipidemia ([place] criteria) Mother with high cholesterol and early onset coronary disease in her 70s Statin intolerance-myalgias  [name] is doing well and tolerating ezetimibe in addition to [place].  His LDL is now essentially at goal at 71 with an LDL particle number now less than 1000.  Triglycerides still remain a little high but this is related to activity and he had had an injury which she has recovered from and expects to be more active this spring which should help those numbers.  Will plan follow-up in 1 year or sooner as necessary.  Patient Risk:   After full review of this patients clinical status, I feel that they are at least moderate risk at this time.  Time:   Today, I have spent 15 minutes with the patient with telehealth technology discussing dyslipidemia.     Medication Adjustments/Labs and Tests Ordered: Current medicines are reviewed at length with the patient today.  Concerns regarding medicines are outlined above.   Tests Ordered: No orders of the defined types were placed in this encounter.   Medication Changes: No orders of the defined types were placed in this encounter.   Disposition:  in 1 year(s)  Pixie Casino, MD, [facility]  Director of the Advanced Lipid Disorders &  Cardiovascular Risk Reduction Clinic Diplomate of the American Board of Clinical Lipidology Attending Cardiologist  Direct Dial: [phone]  Fax: [phone]  Website:  [email or web address]  Pixie Casino, MD  05/02/2022 7:56 AM

## 2022-05-02 NOTE — Addendum Note (Signed)
Addended by: Fidel Levy on: 05/02/2022 08:13 AM   Modules accepted: Orders

## 2022-05-02 NOTE — Patient Instructions (Signed)
Medication Instructions:  NO CHANGES  *If you need a refill on your cardiac medications before your next appointment, please call your pharmacy*   Lab Work: FASTING NMR Lipoprofile in 1 year  If you have labs (blood work) drawn today and your tests are completely normal, you will receive your results only by: [place] (if you have MyChart) OR A paper copy in the mail If you have any lab test that is abnormal or we need to change your treatment, we will call you to review the results.  Follow-Up: At [facility], you and your health needs are our priority.  As part of our continuing mission to provide you with exceptional heart care, we have created designated Provider Care Teams.  These Care Teams include your primary Cardiologist (physician) and Advanced Practice Providers (APPs -  Physician Assistants and Nurse Practitioners) who all work together to provide you with the care you need, when you need it.  We recommend signing up for the patient portal called "MyChart".  Sign up information is provided on this After Visit Summary.  MyChart is used to connect with patients for Virtual Visits (Telemedicine).  Patients are able to view lab/test results, encounter notes, upcoming appointments, etc.  Non-urgent messages can be sent to your provider as well.   To learn more about what you can do with MyChart, go to [email or web address].    Your next appointment:   12 month(s)  Provider:   Lyman Bishop MD - lipid clinic ** call in August/September for your appointment

## 2022-07-14 ENCOUNTER — Other Ambulatory Visit: Payer: Self-pay | Admitting: Internal Medicine

## 2022-07-14 DIAGNOSIS — Z8249 Family history of ischemic heart disease and other diseases of the circulatory system: Secondary | ICD-10-CM

## 2022-07-14 DIAGNOSIS — E7801 Familial hypercholesterolemia: Secondary | ICD-10-CM

## 2022-07-14 DIAGNOSIS — M791 Myalgia, unspecified site: Secondary | ICD-10-CM

## 2022-12-07 ENCOUNTER — Other Ambulatory Visit (HOSPITAL_COMMUNITY): Payer: Self-pay

## 2022-12-07 ENCOUNTER — Telehealth: Payer: Self-pay | Admitting: Pharmacy Technician

## 2022-12-07 NOTE — Telephone Encounter (Signed)
Pharmacy Patient Advocate Encounter   Received notification from Fax that prior authorization for repatha is required/requested.   Insurance verification completed.   The patient is insured through [facility] .   Per test claim: PA required; PA submitted to [facility] via CoverMyMeds Key/confirmation #/EOC [id number] Status is pending

## 2022-12-07 NOTE — Telephone Encounter (Signed)
Pharmacy Patient Advocate Encounter  Received notification from [facility] that Prior Authorization for repatha has been APPROVED from 12/07/22 to 12/06/23. Ran test claim, Copay is $90.00-- 3 months. This test claim was processed through [facility]- copay amounts may vary at other pharmacies due to pharmacy/plan contracts, or as the patient moves through the different stages of their insurance plan.   PA #/Case ID/Reference #: [id number]

## 2023-03-10 ENCOUNTER — Other Ambulatory Visit: Payer: Self-pay | Admitting: *Deleted

## 2023-03-10 DIAGNOSIS — T466X5A Adverse effect of antihyperlipidemic and antiarteriosclerotic drugs, initial encounter: Secondary | ICD-10-CM

## 2023-03-10 DIAGNOSIS — Z8249 Family history of ischemic heart disease and other diseases of the circulatory system: Secondary | ICD-10-CM

## 2023-03-10 DIAGNOSIS — E7801 Familial hypercholesterolemia: Secondary | ICD-10-CM

## 2023-04-18 ENCOUNTER — Telehealth: Payer: Self-pay | Admitting: Internal Medicine

## 2023-04-18 NOTE — Telephone Encounter (Signed)
Patient called to get new orders for lab test

## 2023-04-18 NOTE — Telephone Encounter (Signed)
Called pt, he states Dr. Rennis Golden wanted him to have labs drawn. Pt made aware lab orders are in his chart. Explained the process to have labs drawn here in office. He verbalized understanding. No further questions expressed at this time.

## 2023-04-19 LAB — NMR, LIPOPROFILE
Cholesterol, Total: 208 mg/dL — ABNORMAL HIGH (ref 100–199)
HDL Particle Number: 32.8 umol/L (ref >=30.5)
HDL-C: 46 mg/dL (ref >39)
LDL Particle Number: 1438 nmol/L — ABNORMAL HIGH (ref <1000)
LDL Size: 20.3 nmol — ABNORMAL LOW (ref >20.5)
LDL-C (NIH Calc): 119 mg/dL — ABNORMAL HIGH (ref 0–99)
LP-IR Score: 73 — ABNORMAL HIGH (ref <=45)
Small LDL Particle Number: 827 nmol/L — ABNORMAL HIGH (ref <=527)
Triglycerides: 247 mg/dL — ABNORMAL HIGH (ref 0–149)

## 2023-05-31 NOTE — Progress Notes (Signed)
 Cardiology Office Note:  .   Date:  06/07/2023  ID:  [name], DOB 1960-10-20, MRN [id number] PCP: Tally Joe, MD  [facility] Providers Cardiologist:  None    Patient Profile: .      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 Rennis Golden 07/26/2021.  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  He was referred for consideration of familial hyperlipidemia.  He has a longstanding history of high cholesterol and several family members who also have high cholesterol.  His mother had high cholesterol and had her first MI in her 12s.  He also has a sister with high cholesterol.  He had myalgias on both Lipitor and Crestor.  While untreated, total cholesterol was 318, HDL 49, triglycerides 351, and LDL 198.  He was advised to start Repatha and undergo genetic testing as well as CT calcium score.  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  Dr. Rennis Golden advised this is a genetic cholesterol disorder.  He had remarkable response to Repatha with LDL down from 198 to 108, LDL particle #1488, HDL 48, triglycerides 246, and small LDL particle #743. Zetia 10 mg daily was added for target LDL < 70.   Last lipid clinic visit 05/02/2022 with Dr. Rennis Golden.  He had further improvement in lipids with LDL particle number down to 923 from 1488, LDL-C to 71, down from 108, triglycerides increased slightly to 260 from 246,        History of Present Illness: .   [name] is a pleasant 63 y.o. male who is here today for follow-up of dyslipidemia. He reports he has been off ezetimibe for approximately 4 months due to an expired prescription. LDL  increased from 71 to 829 during this period. The patient also mentions a recent increase in weight and blood pressure, with readings averaging around 130/90. He is physically active, walking daily, going to the gym twice a week, and doing heavy yard work on weekends. However, he admits to needing improvement in his diet, particularly in reducing sugar, salt, and carbohydrate intake. He has been monitoring his blood pressure at home and has stopped taking meloxicam due to concerns about its impact on BP. He denies chest pain, shortness of breath, lower extremity edema, fatigue, palpitations, melena, hematuria, hemoptysis, diaphoresis, weakness, presyncope, syncope, orthopnea, and PND. We offered to get an EKG for baseline reference but he politely refused.   Diet: 3 meals daily Eggs or cereal, coffee  Microwave meals for lunch Chips or nuts and fruit for snack Dinners typically at home - goes out once weekly   Discussed the use of AI scribe software for clinical note transcription with the patient, who gave verbal consent to proceed.   ROS: See HPI       Studies Reviewed: Marland Kitchen        Lipoprotein (a)  Date/Time Value Ref Range Status  04/28/2022 08:31 AM 15.1 <75.0 nmol/L Final    Comment:    Note:  Values greater than or equal to 75.0 nmol/L may        indicate an independent risk factor for CHD,  but must be evaluated with caution when applied        to non-Caucasian populations due to the        influence of genetic factors on Lp(a) across        ethnicities.      Risk Assessment/Calculations:     HYPERTENSION CONTROL Vitals:   06/07/23 0921 06/07/23 0942  BP: (!) 122/90 (!) 128/90    The patient's blood pressure is elevated above target today.  In order to address the patient's elevated BP: Blood pressure will be monitored at home to determine if medication changes need to be made.          Physical Exam:   VS:  BP (!) 128/90   Pulse (!) 59   Ht 5\' 11"  (1.803 m)    Wt 248 lb 3.2 oz (112.6 kg)   SpO2 96%   BMI 34.62 kg/m    Wt Readings from Last 3 Encounters:  06/07/23 248 lb 3.2 oz (112.6 kg)  05/02/22 240 lb (108.9 kg)  12/10/21 235 lb (106.6 kg)    GEN: Well nourished, well developed in no acute distress NECK: No JVD; No carotid bruits CARDIAC: RRR, no murmurs, rubs, gallops RESPIRATORY:  Clear to auscultation without rales, wheezing or rhonchi  ABDOMEN: Soft, non-tender, non-distended EXTREMITIES:  No edema; No deformity     ASSESSMENT AND PLAN: .    Familial hyperlipidemia LDL goal < 70: NMR 04/18/23: LDL particle number 1438, LDL-C 119, HDL-C 46, triglycerides 247, total cholesterol 208, small LDL-P 827, elevated in comparison to previous NMR 04/2022. Has been off Zetia for ~ 4 months. He also admits to the need to improve diet which he is working on. He is compliant with Repatha with no concerning side effects. Resume Zetia 10 mg daily and continue Repatha 140 mg every 14 days. Will get repeat NMR in 3 months for surveillance of lipids once back on ezetimibe. Focus on secondary prevention including heart healthy mostly plant based diet avoiding saturated fat, processed foods, simple carbohydrates, and sugar along with aiming for at least 150 minutes of moderate intensity exercise each week. Particularly encouraged increased intake of high protein, high fiber whole foods and avoiding junk food.   Myalgia due to statin: Tolerating Repatha and ezetimibe with no concerning side effects.   Elevated BP: Advised by PCP to monitor home BP due to recently elevated diastolic BP. BP remains elevated on my recheck. Management per PCP.   CAD: CT calcium score of 5.6 (41st percentile) on CT 08/2021. He denies chest pain, dyspnea, or other symptoms concerning for angina. He politely refused EKG. No indication for further ischemic evaluation at this time.        Disposition:1 year with Dr. Rennis Golden or me (sooner if LDL does not improve on repeat NMR in 3  months)  Signed, Eligha Bridegroom, NP-C

## 2023-06-07 ENCOUNTER — Encounter (HOSPITAL_BASED_OUTPATIENT_CLINIC_OR_DEPARTMENT_OTHER): Payer: Self-pay | Admitting: Nurse Practitioner

## 2023-06-07 ENCOUNTER — Ambulatory Visit (HOSPITAL_BASED_OUTPATIENT_CLINIC_OR_DEPARTMENT_OTHER): Payer: Self-pay | Admitting: Nurse Practitioner

## 2023-06-07 VITALS — BP 128/90 | HR 59 | Ht 71.0 in | Wt 248.2 lb

## 2023-06-07 DIAGNOSIS — T466X5D Adverse effect of antihyperlipidemic and antiarteriosclerotic drugs, subsequent encounter: Secondary | ICD-10-CM

## 2023-06-07 DIAGNOSIS — E7801 Familial hypercholesterolemia: Secondary | ICD-10-CM | POA: Diagnosis not present

## 2023-06-07 DIAGNOSIS — I2510 Atherosclerotic heart disease of native coronary artery without angina pectoris: Secondary | ICD-10-CM | POA: Diagnosis not present

## 2023-06-07 DIAGNOSIS — R030 Elevated blood-pressure reading, without diagnosis of hypertension: Secondary | ICD-10-CM

## 2023-06-07 DIAGNOSIS — M791 Myalgia, unspecified site: Secondary | ICD-10-CM | POA: Diagnosis not present

## 2023-06-07 DIAGNOSIS — Z8249 Family history of ischemic heart disease and other diseases of the circulatory system: Secondary | ICD-10-CM

## 2023-06-07 DIAGNOSIS — T466X5A Adverse effect of antihyperlipidemic and antiarteriosclerotic drugs, initial encounter: Secondary | ICD-10-CM

## 2023-06-07 MED ORDER — REPATHA SURECLICK 140 MG/ML ~~LOC~~ SOAJ: 140.0000 mg | SUBCUTANEOUS | 3 refills | Status: DC

## 2023-06-07 MED ORDER — EZETIMIBE 10 MG PO TABS: 10.0000 mg | ORAL_TABLET | Freq: Every day | ORAL | 3 refills | Status: AC

## 2023-06-07 NOTE — Patient Instructions (Signed)
 Medication Instructions:   Your physician recommends that you continue on your current medications as directed. Please refer to the Current Medication list given to you today.   *If you need a refill on your cardiac medications before your next appointment, please call your pharmacy*  Lab Work:  Your physician recommends that you return for a FASTING NMR profile: IN 3 months fasting after midnight. Patient given paperwork today. List of labcorps below.       If you have labs (blood work) drawn today and your tests are completely normal, you will receive your results only by: MyChart Message (if you have MyChart) OR A paper copy in the mail If you have any lab test that is abnormal or we need to change your treatment, we will call you to review the results.  Testing/Procedures:  None ordered.   Follow-Up: At [facility], you and your health needs are our priority.  As part of our continuing mission to provide you with exceptional heart care, our providers are all part of one team.  This team includes your primary Cardiologist (physician) and Advanced Practice Providers or APPs (Physician Assistants and Nurse Practitioners) who all work together to provide you with the care you need, when you need it.  Your next appointment:   1 year(s)  Provider:   K. Italy Hilty, MD or Eligha Bridegroom, NP    We recommend signing up for the patient portal called "MyChart".  Sign up information is provided on this After Visit Summary.  MyChart is used to connect with patients for Virtual Visits (Telemedicine).  Patients are able to view lab/test results, encounter notes, upcoming appointments, etc.  Non-urgent messages can be sent to your provider as well.   To learn more about what you can do with MyChart, go to [email or web address].   Other Instructions  If labs do not improve we will have to bring you back sooner.

## 2023-06-12 ENCOUNTER — Emergency Department (HOSPITAL_COMMUNITY)
Admission: EM | Admit: 2023-06-12 | Discharge: 2023-06-13 | Discharge status: Against Medical Advice | Attending: Emergency Medicine | Admitting: Emergency Medicine

## 2023-06-12 DIAGNOSIS — R1110 Vomiting, unspecified: Secondary | ICD-10-CM | POA: Insufficient documentation

## 2023-06-12 DIAGNOSIS — Z5321 Procedure and treatment not carried out due to patient leaving prior to being seen by health care provider: Secondary | ICD-10-CM | POA: Diagnosis not present

## 2023-06-12 DIAGNOSIS — R42 Dizziness and giddiness: Secondary | ICD-10-CM | POA: Insufficient documentation

## 2023-06-12 LAB — COMPREHENSIVE METABOLIC PANEL WITH GFR
ALT: 44 U/L (ref 0–44)
AST: 31 U/L (ref 15–41)
Albumin: 4.1 g/dL (ref 3.5–5.0)
Alkaline Phosphatase: 77 U/L (ref 38–126)
Anion gap: 10 (ref 5–15)
BUN: 16 mg/dL (ref 6–20)
CO2: 27 mmol/L (ref 22–32)
Calcium: 9.3 mg/dL (ref 8.9–10.3)
Chloride: 104 mmol/L (ref 98–111)
Creatinine, Ser: 1.06 mg/dL (ref 0.61–1.24)
GFR, Estimated: >60 mL/min (ref >60)
Glucose, Bld: 115 mg/dL — ABNORMAL HIGH (ref 70–99)
Potassium: 4.4 mmol/L (ref 3.5–5.1)
Sodium: 141 mmol/L (ref 135–145)
Total Bilirubin: 0.7 mg/dL (ref 0.0–1.2)
Total Protein: 6.9 g/dL (ref 6.5–8.1)

## 2023-06-12 LAB — CBC
HCT: 51.3 % (ref 39.0–52.0)
Hemoglobin: 16.9 g/dL (ref 13.0–17.0)
MCH: 30.5 pg (ref 26.0–34.0)
MCHC: 32.9 g/dL (ref 30.0–36.0)
MCV: 92.6 fL (ref 80.0–100.0)
Platelets: 171 10*3/uL (ref 150–400)
RBC: 5.54 MIL/uL (ref 4.22–5.81)
RDW: 12.9 % (ref 11.5–15.5)
WBC: 10.7 10*3/uL — ABNORMAL HIGH (ref 4.0–10.5)
nRBC: 0 % (ref 0.0–0.2)

## 2023-06-12 LAB — CBG MONITORING, ED: Glucose-Capillary: 106 mg/dL — ABNORMAL HIGH (ref 70–99)

## 2023-06-12 LAB — LIPASE, BLOOD: Lipase: 54 U/L — ABNORMAL HIGH (ref 11–51)

## 2023-06-12 NOTE — ED Notes (Signed)
Pt left lobby °

## 2023-06-12 NOTE — ED Triage Notes (Signed)
 Pt states that he has hx of vertigo, and for approximately 24 hours ago he has been having dizziness causing emesis. Pt took meclizine 25 mg at 1045.

## 2023-10-16 ENCOUNTER — Other Ambulatory Visit: Payer: Self-pay | Admitting: Internal Medicine

## 2023-10-16 DIAGNOSIS — T466X5A Adverse effect of antihyperlipidemic and antiarteriosclerotic drugs, initial encounter: Secondary | ICD-10-CM

## 2023-10-16 DIAGNOSIS — E7801 Familial hypercholesterolemia: Secondary | ICD-10-CM

## 2023-10-16 DIAGNOSIS — Z8249 Family history of ischemic heart disease and other diseases of the circulatory system: Secondary | ICD-10-CM

## 2024-02-15 ENCOUNTER — Other Ambulatory Visit (HOSPITAL_COMMUNITY): Payer: Self-pay

## 2024-02-15 ENCOUNTER — Telehealth: Payer: Self-pay | Admitting: Internal Medicine

## 2024-02-15 ENCOUNTER — Other Ambulatory Visit: Payer: Self-pay | Admitting: Family

## 2024-02-15 ENCOUNTER — Telehealth: Payer: Self-pay | Admitting: Pharmacy Technician

## 2024-02-15 MED ORDER — METOPROLOL TARTRATE 25 MG PO TABS: 12.5000 mg | ORAL_TABLET | Freq: Every day | ORAL | 0 refills | Status: DC | PRN

## 2024-02-15 NOTE — Telephone Encounter (Signed)
 BP and HR readings look good. Anticipate palpitations due to prednisone. Agree with recommendations provided by nursing team.   May Rx Metoprolol Tartrate 12.5mg  (half of a 25mg  tablet) to be taken once per day as needed for palpitations lasting longer than 10 minutes. Can send 30 tablets with 0 refills in case he wants to have on hand in case during his upcoming trip.  Apphia Cropley S Briar Sword, NP

## 2024-02-15 NOTE — Telephone Encounter (Signed)
 Pharmacy Patient Advocate Encounter   Received notification from CoverMyMeds that prior authorization for Repatha  is required/requested.   Insurance verification completed.   The patient is insured through [facility] [employer].   Per test claim: PA required; PA submitted to above mentioned insurance via Latent Key/confirmation #/EOC [id number] Status is pending

## 2024-02-15 NOTE — Telephone Encounter (Signed)
 Pharmacy Patient Advocate Encounter  Received notification from [employer] that Prior Authorization for Repatha  has been APPROVED from 02/15/24 to 02/14/25. Ran test claim, Copay is $90.00- 3 months. This test claim was processed through [facility]- copay amounts may vary at other pharmacies due to pharmacy/plan contracts, or as the patient moves through the different stages of their insurance plan.   PA #/Case ID/Reference #: [id number]

## 2024-02-15 NOTE — Telephone Encounter (Signed)
 Returned a call back to the pt.   Pt advised to follow all recommendations as previously discussed this morning on the phone.   Pt is aware we will send in a rx for metoprolol tartrate 12.5 mg po daily as needed for palpitations lasting longer than 10 mins.   Confirmed the pharmacy of choice with the pt.   Pt verbalized understanding and agrees with this plan.

## 2024-02-15 NOTE — Telephone Encounter (Signed)
 Operator directly connected me with the pt.   Pt states he has been taking taper prednisone, with last dose taken this morning, for bronchitis.   Pt states since being on the taper prednisone, he's been experiencing palpitations and feeling his heart pound.   He has no other cardiac complaints like chest pain, doe, sob, dizziness, pre-syncope or syncope.  He denies orthopnea.   He states his vitals look good with last BP/HR taken this morning was as so:  111/87 and 126/79 and HR-82.  His SP02 is 96% RA.   Pt is calling in for advice about his complaints of palpitations, and should he be concerned about this, being he will be leaving on a trip to [place]  on Sunday.   Pt is wanting further advisement from Dr. Mona.   Informed the pt that given his last dose of prednisone was this morning, his symptoms should start improving  over time as the medicine leaves his system.   Advised him to avoid all stimulants, including caffeine, decaff, chocolate, teas, etc..  Advised the pt to increase his water intake as well.   Reiterated to the pt that his vitals look good, as well as his heart rates.   Advised the pt when traveling to make sure he wears compression stockings on the plane to promote good circulation.   Pt education provided about signs/symptoms that warrant urgent medical assistance, like 911 or the ER.   Pt is aware I will still run this by Dr. Mona and Reche Finder, NP for further recommendations.  He is aware we will call him accordingly thereafter.   Pt verbalized understanding and agrees with this plan.

## 2024-02-15 NOTE — Telephone Encounter (Signed)
 Patient c/o Palpitations:  STAT if patient reporting lightheadedness, shortness of breath, or chest pain  How long have you had palpitations/irregular HR/ Afib? Are you having the symptoms now? About 2 weeks, worsening   Are you currently experiencing lightheadedness, SOB or CP? Yes   Do you have a history of afib (atrial fibrillation) or irregular heart rhythm? Yes   Have you checked your BP or HR? (document readings if available): No   Are you experiencing any other symptoms? No   Pt states he has been having some arhythmia over the past two weeks. Has been taking a steroid and thinks it may be related. Please advise.

## 2024-02-17 ENCOUNTER — Other Ambulatory Visit: Payer: Self-pay | Admitting: Family

## 2024-02-23 ENCOUNTER — Telehealth: Payer: Self-pay | Admitting: Internal Medicine

## 2024-02-23 NOTE — Telephone Encounter (Signed)
 Patient c/o Palpitations: STAT if patient c/o lightheadedness, shortness of breath, or chest pain  How long have you had palpitations/irregular HR/ Afib? Are you having the symptoms now? Afib today at an ER in [place]    Are you currently experiencing lightheadedness, SOB or CP? No   Do you have a history of afib (atrial fibrillation) or irregular heart rhythm? No   Have you checked your BP or HR? (document readings if available): n/a   Are you experiencing any other symptoms? No

## 2024-02-23 NOTE — Telephone Encounter (Signed)
 S/w the pt- on vacation in [place] - feels was in afib for a few days before going to the ER today. Hr has been very irregular  90-145  History Had vertigo- prescribed prednisone- then after coming off of prednisone- he had irregular heartbeat. Was given metoprolol  tartrate prn  Then he had to take prednisone again due to bronchitis- then had irregular heart beat; even worse than before.   In ER 02/23/24- - was found to be in afib- had lab work, EKG, Coronary CTA, Echocardiogram; filled out paperwork to have records sent here.   They recommended taking Metoprolol  Tartrate 25 mg BID (can take 12.5, but if hr is not below 110 after about 30 minutes, then take the other 12.5 mg)  Also, Prescribed Eliquis 5 mg bid.   Went ahead and scheduled a f/u visit with Dr Mona for 03/11/24. Pt is feeling much better at this time.

## 2024-02-28 ENCOUNTER — Telehealth: Payer: Self-pay | Admitting: Internal Medicine

## 2024-02-28 NOTE — Telephone Encounter (Signed)
 Tobb, Kardie, DO to [facility] Triage  Mona Vinie BROCKS, MD  (Selected Message)     02/28/24 10:21 AM He needs to see his PCP or ED  S/w the patient and gave him the information above. He will go to the ER to get it checked out.

## 2024-02-28 NOTE — Telephone Encounter (Signed)
 Appt 03/11/24  Recently started Eliquis on 02/23/24 due to afib  His left knee started swelling last night and this morning. Has compression and ice on it.   No redness, a little warm to the touch, and right below patella it is sore to touch.   The patient is still in [place] . Informed that the provider may want him to get a scan to rule out dvt; but he is in [place] , would need to go to the ER.  He asked if he can just monitor it for now. Informed him that I would ask his provider and call him back. He verbalized understanding.

## 2024-02-28 NOTE — Telephone Encounter (Signed)
 Pt c/o swelling/edema: STAT if pt has developed SOB within 24 hours  If swelling, where is the swelling located? Left knee  How much weight have you gained and in what time span? N/A  Have you gained 2 pounds in a day or 5 pounds in a week? N/A  Do you have a log of your daily weights (if so, list)? No  Are you currently taking a fluid pill? Yes  Are you currently SOB? No   Have you traveled recently in a car or plane for an extended period of time?

## 2024-03-11 ENCOUNTER — Ambulatory Visit: Attending: Internal Medicine | Admitting: Internal Medicine

## 2024-03-11 ENCOUNTER — Other Ambulatory Visit (HOSPITAL_COMMUNITY): Payer: Self-pay

## 2024-03-11 VITALS — BP 106/74 | HR 144 | Ht 71.0 in | Wt 237.0 lb

## 2024-03-11 DIAGNOSIS — T466X5D Adverse effect of antihyperlipidemic and antiarteriosclerotic drugs, subsequent encounter: Secondary | ICD-10-CM | POA: Diagnosis not present

## 2024-03-11 DIAGNOSIS — Z8249 Family history of ischemic heart disease and other diseases of the circulatory system: Secondary | ICD-10-CM

## 2024-03-11 DIAGNOSIS — M791 Myalgia, unspecified site: Secondary | ICD-10-CM

## 2024-03-11 DIAGNOSIS — T466X5A Adverse effect of antihyperlipidemic and antiarteriosclerotic drugs, initial encounter: Secondary | ICD-10-CM

## 2024-03-11 DIAGNOSIS — I4891 Unspecified atrial fibrillation: Secondary | ICD-10-CM

## 2024-03-11 DIAGNOSIS — E78019 Familial hypercholesterolemia, unspecified: Secondary | ICD-10-CM

## 2024-03-11 MED ORDER — REPATHA SURECLICK 140 MG/ML ~~LOC~~ SOAJ
140.0000 mg | SUBCUTANEOUS | 3 refills | Status: AC
  Filled 2024-03-11: qty 6, 84d supply, fill #0

## 2024-03-11 MED ORDER — REPATHA SURECLICK 140 MG/ML ~~LOC~~ SOAJ: 140.0000 mg | SUBCUTANEOUS | 3 refills | Status: DC

## 2024-03-11 MED ORDER — METOPROLOL TARTRATE 25 MG PO TABS
25.0000 mg | ORAL_TABLET | Freq: Two times a day (BID) | ORAL | 5 refills | Status: DC
  Filled 2024-03-11: qty 60, 30d supply, fill #0

## 2024-03-11 MED ORDER — APIXABAN 5 MG PO TABS
5.0000 mg | ORAL_TABLET | Freq: Two times a day (BID) | ORAL | 5 refills | Status: DC
  Filled 2024-03-11 – 2024-03-28 (×2): qty 60, 30d supply, fill #0

## 2024-03-11 NOTE — H&P (View-Only) (Signed)
 "  OFFICE NOTE  Chief Complaint:  New onset afib  Primary Care Physician: Seabron Lenis, MD  Primary Cardiologist:  None  HPI:  [name] is a 64 y.o. 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  He reports a longstanding history of high cholesterol as well as high cholesterol in multiple family members.  Particularly his mother had high cholesterol and had her first MI in her 49s.  He also has a sister with high cholesterol.  In the past he has been on both Lipitor and Crestor but had side effects with those including myalgias.  He had recent labs which indicated untreated lipids.  His total cholesterol was 318, HDL 49, triglycerides 351 and LDL 198.  03/11/2024  [name] is seen today in follow-up.  He recently was seen in the emergency department in [place]  for new onset atrial fibrillation.  He thinks it may have started around Thanksgiving.  He was given steroids at that time for vertigo.  He has had several episodes of this.  Then he developed an upper respiratory infection and again underwent another round of steroids and then traveled to [place]  at an altitude of about 7500 feet.  While he was out there trying to ski he noted that he was more short of breath and tachycardic.  In the ER he was noted to be in atrial fibrillation with rapid ventricular response.  He was advised to use as needed metoprolol  and started on Eliquis  5 mg twice daily.  He notes that his heart rate has generally been in the 50s or 60s at times at home and therefore he has not been taking his his metoprolol , however his EKG today shows he is in atrial fibrillation with a rate of 144.  His watch however is indicating a heart rate of 60, demonstrating that it is unlikely that  the watch is accurate and able to track the fast rate.  Blood pressure was unusually low for him today at 106/74, possibly due to fast A-fib.  He did have an echo there as well which showed a normal LVEF of 60% despite an elevated proBNP.  He has otherwise no heart failure symptoms.  A CT pulmonary angiogram was performed which was negative for PE.  He does have some known coronary calcium back in 2023.  He now reports that he is symptomatic with fast rates like today where he says he has been sweaty and hot.  PMHx:  Past Medical History:  Diagnosis Date   Familial hyperlipidemia     FAMHx:  Family History  Problem Relation Age of Onset   Hyperlipidemia Mother    Heart attack Mother     SOCHx:   reports that he has never smoked. He has never used smokeless tobacco. He reports current alcohol use. He reports that he does not use drugs.  ALLERGIES:  Allergies  Allergen Reactions   Statins Other (See Comments)    Myalgia    ROS: Pertinent items noted in HPI and remainder of comprehensive ROS otherwise negative.  HOME MEDS: Current Outpatient Medications on File Prior to Visit  Medication Sig Dispense Refill   apixaban  (ELIQUIS ) 5 MG TABS tablet Take 5 mg by mouth 2 (two) times daily.     azelastine (ASTELIN) 0.1 %  nasal spray as needed for allergies.     Evolocumab  (REPATHA  SURECLICK) 140 MG/ML SOAJ ADMINISTER 1 ML UNDER THE SKIN EVERY 14 DAYS 6 mL 1   ezetimibe  (ZETIA ) 10 MG tablet Take 1 tablet (10 mg total) by mouth daily. 90 tablet 3   fluticasone (FLONASE) 50 MCG/ACT nasal spray Place 2 sprays into both nostrils daily.     levocetirizine (XYZAL) 5 MG tablet Take 5 mg by mouth at bedtime as needed for allergies.     metoprolol  tartrate (LOPRESSOR ) 25 MG tablet Take 25 mg by mouth 2 (two) times daily.     No current facility-administered medications on file prior to visit.    LABS/IMAGING: No results found for this or any previous visit (from the past 48 hours). No results  found.  LIPID PANEL: No results found for: CHOL, TRIG, HDL, CHOLHDL, VLDL, LDLCALC, LDLDIRECT  WEIGHTS: Wt Readings from Last 3 Encounters:  03/11/24 237 lb (107.5 kg)  06/07/23 248 lb 3.2 oz (112.6 kg)  05/02/22 240 lb (108.9 kg)    VITALS: BP 106/74 (BP Location: Left Arm)   Pulse (!) 144   Ht 5' 11 (1.803 m)   Wt 237 lb (107.5 kg)   SpO2 98%   BMI 33.05 kg/m   EXAM: General appearance: alert and no distress Neck: no carotid bruit, no JVD, and thyroid not enlarged, symmetric, no tenderness/mass/nodules Lungs: clear to auscultation bilaterally Heart: irregularly irregular rhythm and tachycardic Abdomen: soft, non-tender; bowel sounds normal; no masses,  no organomegaly Extremities: extremities normal, atraumatic, no cyanosis or edema Pulses: 2+ and symmetric Skin: Skin color, texture, turgor normal. No rashes or lesions Neurologic: Grossly normal Psych: Pleasant  EKG: EKG Interpretation Date/Time:  Monday March 11 2024 08:17:00 EST Ventricular Rate:  144 PR Interval:    QRS Duration:  78 QT Interval:  262 QTC Calculation: 405 R Axis:   -54  Text Interpretation: Atrial fibrillation with rapid ventricular response Left axis deviation Septal infarct , age undetermined When compared with ECG of 22-May-2008 04:23, Atrial fibrillation has replaced Sinus rhythm Vent. 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   [name] had new onset atrial fibrillation with rapid ventricular response.  He is persistently in A-fib now with fast rates.   He has not been taking regular metoprolol  because he had noted low heart rates on his watch but appears to be in A-fib and the watch seems to be under sensing his rate.  I advised him to monitor heart rate with a pulse oximeter or manually taking his pulse rate.  Will start daily metoprolol  to tartrate 25 mg twice daily.  Monitor blood pressure.  Continue Eliquis  5 mg twice daily.  He has not missed any doses and started therapy on February 23, 2024.  Will set him up for an elective outpatient cardioversion likely next Monday which will be more than 3 weeks on therapy.  He is advised not to miss any doses of his Eliquis .  His CHA2DS2-VASc score is 0 therefore he may be able to come off anticoagulation after 1 month post cardioversion.  Will schedule follow-up afterwards  with APP or the Afib clinic.  Informed Consent   Shared Decision Making/Informed Consent The risks (stroke, cardiac arrhythmias rarely resulting in the need for a temporary or permanent pacemaker, skin irritation or burns and complications associated with conscious sedation including aspiration, arrhythmia, respiratory failure and death), benefits (restoration of normal sinus rhythm) and alternatives of a direct current cardioversion were explained in detail to [name] and he agrees to proceed.       George KYM Maxcy, MD, [facility], FNLA, FACP    [facility]  Medical Director of the Advanced Lipid Disorders &  Cardiovascular Risk Reduction Clinic Diplomate of the American Board of Clinical Lipidology Attending Cardiologist  Direct Dial: [phone]  Fax: [phone]  Website:  [email or web address]   [name] 03/11/2024, 8:21 AM "

## 2024-03-11 NOTE — Patient Instructions (Signed)
 Medication Instructions:   Refilled: Metoprolol  tartrate 25mg  twice daily Eliquis  twice daily Repatha    *If you need a refill on your cardiac medications before your next appointment, please call your pharmacy*  Lab Work:  BMET and CBC today on the 1st Floor  If you have labs (blood work) drawn today and your tests are completely normal, you will receive your results only by: MyChart Message (if you have MyChart) OR A paper copy in the mail If you have any lab test that is abnormal or we need to change your treatment, we will call you to review the results.  Testing/Procedures:  Cardioversion at [facility] on 03/19/23 with Dr. Georganna Archer  Follow-Up: At [facility], you and your health needs are our priority.  As part of our continuing mission to provide you with exceptional heart care, our providers are all part of one team.  This team includes your primary Cardiologist (physician) and Advanced Practice Providers or APPs (Physician Assistants and Nurse Practitioners) who all work together to provide you with the care you need, when you need it.  Your next appointment:    3-4 weeks Cardioversion with PA or NP  We recommend signing up for the patient portal called MyChart.  Sign up information is provided on this After Visit Summary.  MyChart is used to connect with patients for Virtual Visits (Telemedicine).  Patients are able to view lab/test results, encounter notes, upcoming appointments, etc.  Non-urgent messages can be sent to your provider as well.   To learn more about what you can do with MyChart, go to [email or web address].   Other Instructions     Dear [name]  You are scheduled for a Cardioversion on Monday, January 12 with Dr. Archer.  Please arrive at the [facility] (Main Entrance A) at [facility]: [address] [place], [place] [address] at 7:00 AM (This time is 1 hour(s) before your procedure to ensure your preparation).    Free valet parking service is available. You will check in at ADMITTING.   *Please Note: You will receive a call the day before your procedure to confirm the appointment time. That time may have changed from the original time based on the schedule for that day.*   DIET:  Nothing to eat or drink after midnight except a sip of water with medications (see medication instructions below)  MEDICATION INSTRUCTIONS: !!IF ANY NEW MEDICATIONS ARE STARTED AFTER TODAY, PLEASE NOTIFY YOUR PROVIDER AS SOON AS POSSIBLE!!  FYI: Medications such as Semaglutide (Ozempic, Wegovy), Tirzepatide (Mounjaro, Zepbound), Dulaglutide (Trulicity), etc (GLP1 agonists) AND Canagliflozin (Invokana), Dapagliflozin (Farxiga), Empagliflozin (Jardiance), Ertugliflozin (Steglatro), Bexagliflozin [employer]) or any combination with one of these drugs such as Invokamet (Canagliflozin/Metformin), Synjardy (Empagliflozin/Metformin), etc (SGLT2 inhibitors) must be held around the time of a procedure. This is not a comprehensive list of all of these drugs. Please review all of your medications and talk to your provider if you take any one of these. If you are not sure, ask your provider.   Continue taking your anticoagulant (blood thinner): Apixaban  (Eliquis ).  You will need to continue this after your procedure until you are told by your provider that it is safe to stop.    LABS: CBC & BMET today 03/11/2024  FYI:  For your safety, and to allow us  to monitor your vital signs accurately during the surgery/procedure we request: If you have artificial nails, gel coating, SNS etc, please have those removed prior to your surgery/procedure. Not having  the nail coverings /polish removed may result in cancellation or delay of your surgery/procedure.  Your support person will be asked to wait in the waiting room during your procedure.  It is OK to have someone drop you off and come back when you are ready to be discharged.  You cannot drive  after the procedure and will need someone to drive you home.  Bring your insurance cards.  *Special Note: Every effort is made to have your procedure done on time. Occasionally there are emergencies that occur at the hospital that may cause delays. Please be patient if a delay does occur.

## 2024-03-11 NOTE — Progress Notes (Addendum)
 "  OFFICE NOTE  Chief Complaint:  New onset afib  Primary Care Physician: Seabron Lenis, MD  Primary Cardiologist:  None  HPI:  [name] is a 64 y.o. 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  He reports a longstanding history of high cholesterol as well as high cholesterol in multiple family members.  Particularly his mother had high cholesterol and had her first MI in her 49s.  He also has a sister with high cholesterol.  In the past he has been on both Lipitor and Crestor but had side effects with those including myalgias.  He had recent labs which indicated untreated lipids.  His total cholesterol was 318, HDL 49, triglycerides 351 and LDL 198.  03/11/2024  [name] is seen today in follow-up.  He recently was seen in the emergency department in [place]  for new onset atrial fibrillation.  He thinks it may have started around Thanksgiving.  He was given steroids at that time for vertigo.  He has had several episodes of this.  Then he developed an upper respiratory infection and again underwent another round of steroids and then traveled to [place]  at an altitude of about 7500 feet.  While he was out there trying to ski he noted that he was more short of breath and tachycardic.  In the ER he was noted to be in atrial fibrillation with rapid ventricular response.  He was advised to use as needed metoprolol  and started on Eliquis  5 mg twice daily.  He notes that his heart rate has generally been in the 50s or 60s at times at home and therefore he has not been taking his his metoprolol , however his EKG today shows he is in atrial fibrillation with a rate of 144.  His watch however is indicating a heart rate of 60, demonstrating that it is unlikely that  the watch is accurate and able to track the fast rate.  Blood pressure was unusually low for him today at 106/74, possibly due to fast A-fib.  He did have an echo there as well which showed a normal LVEF of 60% despite an elevated proBNP.  He has otherwise no heart failure symptoms.  A CT pulmonary angiogram was performed which was negative for PE.  He does have some known coronary calcium back in 2023.  He now reports that he is symptomatic with fast rates like today where he says he has been sweaty and hot.  PMHx:  Past Medical History:  Diagnosis Date   Familial hyperlipidemia     FAMHx:  Family History  Problem Relation Age of Onset   Hyperlipidemia Mother    Heart attack Mother     SOCHx:   reports that he has never smoked. He has never used smokeless tobacco. He reports current alcohol use. He reports that he does not use drugs.  ALLERGIES:  Allergies  Allergen Reactions   Statins Other (See Comments)    Myalgia    ROS: Pertinent items noted in HPI and remainder of comprehensive ROS otherwise negative.  HOME MEDS: Current Outpatient Medications on File Prior to Visit  Medication Sig Dispense Refill   apixaban  (ELIQUIS ) 5 MG TABS tablet Take 5 mg by mouth 2 (two) times daily.     azelastine (ASTELIN) 0.1 %  nasal spray as needed for allergies.     Evolocumab  (REPATHA  SURECLICK) 140 MG/ML SOAJ ADMINISTER 1 ML UNDER THE SKIN EVERY 14 DAYS 6 mL 1   ezetimibe  (ZETIA ) 10 MG tablet Take 1 tablet (10 mg total) by mouth daily. 90 tablet 3   fluticasone (FLONASE) 50 MCG/ACT nasal spray Place 2 sprays into both nostrils daily.     levocetirizine (XYZAL) 5 MG tablet Take 5 mg by mouth at bedtime as needed for allergies.     metoprolol  tartrate (LOPRESSOR ) 25 MG tablet Take 25 mg by mouth 2 (two) times daily.     No current facility-administered medications on file prior to visit.    LABS/IMAGING: No results found for this or any previous visit (from the past 48 hours). No results  found.  LIPID PANEL: No results found for: CHOL, TRIG, HDL, CHOLHDL, VLDL, LDLCALC, LDLDIRECT  WEIGHTS: Wt Readings from Last 3 Encounters:  03/11/24 237 lb (107.5 kg)  06/07/23 248 lb 3.2 oz (112.6 kg)  05/02/22 240 lb (108.9 kg)    VITALS: BP 106/74 (BP Location: Left Arm)   Pulse (!) 144   Ht 5' 11 (1.803 m)   Wt 237 lb (107.5 kg)   SpO2 98%   BMI 33.05 kg/m   EXAM: General appearance: alert and no distress Neck: no carotid bruit, no JVD, and thyroid not enlarged, symmetric, no tenderness/mass/nodules Lungs: clear to auscultation bilaterally Heart: irregularly irregular rhythm and tachycardic Abdomen: soft, non-tender; bowel sounds normal; no masses,  no organomegaly Extremities: extremities normal, atraumatic, no cyanosis or edema Pulses: 2+ and symmetric Skin: Skin color, texture, turgor normal. No rashes or lesions Neurologic: Grossly normal Psych: Pleasant  EKG: EKG Interpretation Date/Time:  Monday March 11 2024 08:17:00 EST Ventricular Rate:  144 PR Interval:    QRS Duration:  78 QT Interval:  262 QTC Calculation: 405 R Axis:   -54  Text Interpretation: Atrial fibrillation with rapid ventricular response Left axis deviation Septal infarct , age undetermined When compared with ECG of 22-May-2008 04:23, Atrial fibrillation has replaced Sinus rhythm Vent. 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   [name] had new onset atrial fibrillation with rapid ventricular response.  He is persistently in A-fib now with fast rates.   He has not been taking regular metoprolol  because he had noted low heart rates on his watch but appears to be in A-fib and the watch seems to be under sensing his rate.  I advised him to monitor heart rate with a pulse oximeter or manually taking his pulse rate.  Will start daily metoprolol  to tartrate 25 mg twice daily.  Monitor blood pressure.  Continue Eliquis  5 mg twice daily.  He has not missed any doses and started therapy on February 23, 2024.  Will set him up for an elective outpatient cardioversion likely next Monday which will be more than 3 weeks on therapy.  He is advised not to miss any doses of his Eliquis .  His CHA2DS2-VASc score is 0 therefore he may be able to come off anticoagulation after 1 month post cardioversion.  Will schedule follow-up afterwards  with APP or the Afib clinic.  Informed Consent   Shared Decision Making/Informed Consent The risks (stroke, cardiac arrhythmias rarely resulting in the need for a temporary or permanent pacemaker, skin irritation or burns and complications associated with conscious sedation including aspiration, arrhythmia, respiratory failure and death), benefits (restoration of normal sinus rhythm) and alternatives of a direct current cardioversion were explained in detail to Mr. Karpowicz and he agrees to proceed.       Vinie KYM Maxcy, MD, [facility], FNLA, FACP    [facility]  Medical Director of the Advanced Lipid Disorders &  Cardiovascular Risk Reduction Clinic Diplomate of the American Board of Clinical Lipidology Attending Cardiologist  Direct Dial: [phone]  Fax: [phone]  Website:  [email or web address]   Vinie BROCKS Junnie Loschiavo 03/11/2024, 8:21 AM "

## 2024-03-12 ENCOUNTER — Ambulatory Visit: Payer: Self-pay | Admitting: Internal Medicine

## 2024-03-12 LAB — BASIC METABOLIC PANEL WITH GFR
BUN/Creatinine Ratio: 12 (ref 10–24)
BUN: 14 mg/dL (ref 8–27)
CO2: 21 mmol/L (ref 20–29)
Calcium: 9.7 mg/dL (ref 8.6–10.2)
Chloride: 104 mmol/L (ref 96–106)
Creatinine, Ser: 1.21 mg/dL (ref 0.76–1.27)
Glucose: 117 mg/dL — ABNORMAL HIGH (ref 70–99)
Potassium: 4.8 mmol/L (ref 3.5–5.2)
Sodium: 143 mmol/L (ref 134–144)
eGFR: 68 mL/min/1.73

## 2024-03-12 LAB — CBC
Hematocrit: 52.2 % — ABNORMAL HIGH (ref 37.5–51.0)
Hemoglobin: 17 g/dL (ref 13.0–17.7)
MCH: 30.2 pg (ref 26.6–33.0)
MCHC: 32.6 g/dL (ref 31.5–35.7)
MCV: 93 fL (ref 79–97)
Platelets: 230 x10E3/uL (ref 150–450)
RBC: 5.63 x10E6/uL (ref 4.14–5.80)
RDW: 12.8 % (ref 11.6–15.4)
WBC: 9.7 x10E3/uL (ref 3.4–10.8)

## 2024-03-18 ENCOUNTER — Ambulatory Visit (HOSPITAL_COMMUNITY)
Admission: RE | Admit: 2024-03-18 | Discharge: 2024-03-18 | Disposition: A | Attending: Student in an Organized Health Care Education/Training Program | Admitting: Student in an Organized Health Care Education/Training Program

## 2024-03-18 ENCOUNTER — Encounter (HOSPITAL_COMMUNITY): Payer: Self-pay | Admitting: Certified Registered"

## 2024-03-18 ENCOUNTER — Encounter: Payer: Self-pay | Admitting: Internal Medicine

## 2024-03-18 ENCOUNTER — Encounter (HOSPITAL_COMMUNITY)
Admission: RE | Disposition: A | Discharge status: Home / Self Care | Payer: Self-pay | Source: Home / Self Care | Attending: Student in an Organized Health Care Education/Training Program

## 2024-03-18 DIAGNOSIS — Z538 Procedure and treatment not carried out for other reasons: Secondary | ICD-10-CM | POA: Insufficient documentation

## 2024-03-18 DIAGNOSIS — I4819 Other persistent atrial fibrillation: Secondary | ICD-10-CM | POA: Diagnosis not present

## 2024-03-18 SURGERY — CARDIOVERSION (CATH LAB)
Anesthesia: Monitor Anesthesia Care

## 2024-03-18 NOTE — Interval H&P Note (Signed)
 History and Physical Interval Note:  03/18/2024 7:35 AM  [name]  has presented today for surgery, with the diagnosis of AFIB.  The various methods of treatment have been discussed with the patient and family. He was found to be in normal sinus rhythm upon presenting to his procedure, so cardioversion was not needed. I instructed the patient to take half his normal dose of metoprolol  since his resting heart rate is in the 50's. He was reach out to his primary cardiologist for further instructions.   [name]

## 2024-03-20 MED ORDER — METOPROLOL TARTRATE 25 MG PO TABS: 12.5000 mg | ORAL_TABLET | Freq: Two times a day (BID) | ORAL | Status: DC

## 2024-03-20 NOTE — Telephone Encounter (Signed)
 Patient sent same message in another MyChart encounter thread, which has been routed to [facility] MD as of 03/20/14. Will close this encounter.

## 2024-03-28 ENCOUNTER — Other Ambulatory Visit (HOSPITAL_COMMUNITY): Payer: Self-pay

## 2024-04-10 ENCOUNTER — Ambulatory Visit: Admitting: Cardiology

## 2024-04-10 VITALS — BP 112/70 | HR 52 | Ht 71.0 in | Wt 243.8 lb

## 2024-04-10 DIAGNOSIS — I480 Paroxysmal atrial fibrillation: Secondary | ICD-10-CM

## 2024-04-10 DIAGNOSIS — E78019 Familial hypercholesterolemia, unspecified: Secondary | ICD-10-CM

## 2024-04-10 MED ORDER — METOPROLOL TARTRATE 25 MG PO TABS: ORAL_TABLET | ORAL | Status: AC

## 2024-04-10 NOTE — Patient Instructions (Addendum)
 Medication Instructions:  Stop Eliquis  Take Metoprolol  25mg  1/2 tablet as needed for palpitations or elevated heart rate  *If you need a refill on your cardiac medications before your next appointment, please call your pharmacy*  Lab Work: Today: NMR Lipoprofile If you have labs (blood work) drawn today and your tests are completely normal, you will receive your results only by: MyChart Message (if you have MyChart) OR A paper copy in the mail If you have any lab test that is abnormal or we need to change your treatment, we will call you to review the results.  Testing/Procedures: none   Follow-Up: At [facility], you and your health needs are our priority.  As part of our continuing mission to provide you with exceptional heart care, our providers are all part of one team.  This team includes your primary Cardiologist (physician) and Advanced Practice Providers or APPs (Physician Assistants and Nurse Practitioners) who all work together to provide you with the care you need, when you need it.  Your next appointment:   6 month(s)  Provider:   Vinie JAYSON Maxcy, MD     Other Instructions None

## 2024-04-11 LAB — NMR, LIPOPROFILE
Cholesterol, Total: 179 mg/dL (ref 100–199)
HDL Particle Number: 32.3 umol/L
HDL-C: 54 mg/dL
LDL Particle Number: 1131 nmol/L — ABNORMAL HIGH
LDL Size: 20.8 nm
LDL-C (NIH Calc): 95 mg/dL (ref 0–99)
LP-IR Score: 63 — ABNORMAL HIGH
Small LDL Particle Number: 423 nmol/L
Triglycerides: 172 mg/dL — ABNORMAL HIGH (ref 0–149)

## 2024-08-30 ENCOUNTER — Ambulatory Visit: Admitting: Internal Medicine
